# Patient Record
Sex: Male | Born: 1940 | Race: White | Hispanic: No | Marital: Married | Smoking: Former smoker
Health system: Southern US, Community
[De-identification: ages and names within clinical notes are randomized; demographics above are authoritative.]

## PROBLEM LIST (undated history)

## (undated) DIAGNOSIS — C801 Malignant (primary) neoplasm, unspecified: Secondary | ICD-10-CM

## (undated) DIAGNOSIS — I639 Cerebral infarction, unspecified: Secondary | ICD-10-CM

## (undated) DIAGNOSIS — I10 Essential (primary) hypertension: Secondary | ICD-10-CM

## (undated) DIAGNOSIS — M1990 Unspecified osteoarthritis, unspecified site: Secondary | ICD-10-CM

## (undated) DIAGNOSIS — R238 Other skin changes: Secondary | ICD-10-CM

## (undated) DIAGNOSIS — M2550 Pain in unspecified joint: Secondary | ICD-10-CM

## (undated) DIAGNOSIS — J302 Other seasonal allergic rhinitis: Secondary | ICD-10-CM

## (undated) DIAGNOSIS — H269 Unspecified cataract: Secondary | ICD-10-CM

## (undated) DIAGNOSIS — R233 Spontaneous ecchymoses: Secondary | ICD-10-CM

## (undated) HISTORY — PX: OTHER SURGICAL HISTORY: SHX169

## (undated) HISTORY — PX: ESOPHAGOGASTRODUODENOSCOPY: SHX1529

## (undated) HISTORY — PX: COLONOSCOPY: SHX174

---

## 2011-07-10 DIAGNOSIS — L989 Disorder of the skin and subcutaneous tissue, unspecified: Secondary | ICD-10-CM | POA: Diagnosis not present

## 2011-07-10 DIAGNOSIS — E538 Deficiency of other specified B group vitamins: Secondary | ICD-10-CM | POA: Diagnosis not present

## 2011-07-14 DIAGNOSIS — D045 Carcinoma in situ of skin of trunk: Secondary | ICD-10-CM | POA: Diagnosis not present

## 2011-07-14 DIAGNOSIS — L570 Actinic keratosis: Secondary | ICD-10-CM | POA: Diagnosis not present

## 2011-07-14 DIAGNOSIS — C4442 Squamous cell carcinoma of skin of scalp and neck: Secondary | ICD-10-CM | POA: Diagnosis not present

## 2011-07-24 DIAGNOSIS — E538 Deficiency of other specified B group vitamins: Secondary | ICD-10-CM | POA: Diagnosis not present

## 2011-07-24 DIAGNOSIS — J449 Chronic obstructive pulmonary disease, unspecified: Secondary | ICD-10-CM | POA: Diagnosis not present

## 2011-07-24 DIAGNOSIS — J4489 Other specified chronic obstructive pulmonary disease: Secondary | ICD-10-CM | POA: Diagnosis not present

## 2011-07-24 DIAGNOSIS — I10 Essential (primary) hypertension: Secondary | ICD-10-CM | POA: Diagnosis not present

## 2011-07-24 DIAGNOSIS — IMO0002 Reserved for concepts with insufficient information to code with codable children: Secondary | ICD-10-CM | POA: Diagnosis not present

## 2011-07-24 DIAGNOSIS — M159 Polyosteoarthritis, unspecified: Secondary | ICD-10-CM | POA: Diagnosis not present

## 2011-07-24 DIAGNOSIS — Z79899 Other long term (current) drug therapy: Secondary | ICD-10-CM | POA: Diagnosis not present

## 2011-07-24 DIAGNOSIS — F17200 Nicotine dependence, unspecified, uncomplicated: Secondary | ICD-10-CM | POA: Diagnosis not present

## 2011-07-28 DIAGNOSIS — R7989 Other specified abnormal findings of blood chemistry: Secondary | ICD-10-CM | POA: Diagnosis not present

## 2011-07-28 DIAGNOSIS — E871 Hypo-osmolality and hyponatremia: Secondary | ICD-10-CM | POA: Diagnosis not present

## 2011-08-01 DIAGNOSIS — D649 Anemia, unspecified: Secondary | ICD-10-CM | POA: Diagnosis not present

## 2011-08-01 DIAGNOSIS — D473 Essential (hemorrhagic) thrombocythemia: Secondary | ICD-10-CM | POA: Diagnosis not present

## 2011-08-10 DIAGNOSIS — D649 Anemia, unspecified: Secondary | ICD-10-CM | POA: Diagnosis not present

## 2011-08-10 DIAGNOSIS — D696 Thrombocytopenia, unspecified: Secondary | ICD-10-CM | POA: Diagnosis not present

## 2011-08-10 DIAGNOSIS — D473 Essential (hemorrhagic) thrombocythemia: Secondary | ICD-10-CM | POA: Diagnosis not present

## 2011-08-10 DIAGNOSIS — E538 Deficiency of other specified B group vitamins: Secondary | ICD-10-CM | POA: Diagnosis not present

## 2011-08-18 DIAGNOSIS — D473 Essential (hemorrhagic) thrombocythemia: Secondary | ICD-10-CM | POA: Diagnosis not present

## 2011-08-18 DIAGNOSIS — D649 Anemia, unspecified: Secondary | ICD-10-CM | POA: Diagnosis not present

## 2011-08-28 DIAGNOSIS — L570 Actinic keratosis: Secondary | ICD-10-CM | POA: Diagnosis not present

## 2011-08-28 DIAGNOSIS — D045 Carcinoma in situ of skin of trunk: Secondary | ICD-10-CM | POA: Diagnosis not present

## 2011-09-07 DIAGNOSIS — E538 Deficiency of other specified B group vitamins: Secondary | ICD-10-CM | POA: Diagnosis not present

## 2011-09-08 DIAGNOSIS — M159 Polyosteoarthritis, unspecified: Secondary | ICD-10-CM | POA: Diagnosis not present

## 2011-09-08 DIAGNOSIS — Z79899 Other long term (current) drug therapy: Secondary | ICD-10-CM | POA: Diagnosis not present

## 2011-10-09 DIAGNOSIS — E538 Deficiency of other specified B group vitamins: Secondary | ICD-10-CM | POA: Diagnosis not present

## 2011-10-17 DIAGNOSIS — D473 Essential (hemorrhagic) thrombocythemia: Secondary | ICD-10-CM | POA: Diagnosis not present

## 2011-10-19 DIAGNOSIS — D509 Iron deficiency anemia, unspecified: Secondary | ICD-10-CM | POA: Diagnosis not present

## 2011-10-23 DIAGNOSIS — I10 Essential (primary) hypertension: Secondary | ICD-10-CM | POA: Diagnosis not present

## 2011-10-23 DIAGNOSIS — J449 Chronic obstructive pulmonary disease, unspecified: Secondary | ICD-10-CM | POA: Diagnosis not present

## 2011-10-23 DIAGNOSIS — E538 Deficiency of other specified B group vitamins: Secondary | ICD-10-CM | POA: Diagnosis not present

## 2011-10-23 DIAGNOSIS — M159 Polyosteoarthritis, unspecified: Secondary | ICD-10-CM | POA: Diagnosis not present

## 2011-11-08 DIAGNOSIS — E538 Deficiency of other specified B group vitamins: Secondary | ICD-10-CM | POA: Diagnosis not present

## 2011-12-11 DIAGNOSIS — E538 Deficiency of other specified B group vitamins: Secondary | ICD-10-CM | POA: Diagnosis not present

## 2011-12-13 DIAGNOSIS — M159 Polyosteoarthritis, unspecified: Secondary | ICD-10-CM | POA: Diagnosis not present

## 2011-12-13 DIAGNOSIS — Z79899 Other long term (current) drug therapy: Secondary | ICD-10-CM | POA: Diagnosis not present

## 2012-01-10 DIAGNOSIS — E538 Deficiency of other specified B group vitamins: Secondary | ICD-10-CM | POA: Diagnosis not present

## 2012-02-01 DIAGNOSIS — M159 Polyosteoarthritis, unspecified: Secondary | ICD-10-CM | POA: Diagnosis not present

## 2012-02-01 DIAGNOSIS — Z79899 Other long term (current) drug therapy: Secondary | ICD-10-CM | POA: Diagnosis not present

## 2012-02-12 DIAGNOSIS — E538 Deficiency of other specified B group vitamins: Secondary | ICD-10-CM | POA: Diagnosis not present

## 2012-02-16 DIAGNOSIS — D509 Iron deficiency anemia, unspecified: Secondary | ICD-10-CM | POA: Diagnosis not present

## 2012-02-16 DIAGNOSIS — D473 Essential (hemorrhagic) thrombocythemia: Secondary | ICD-10-CM | POA: Diagnosis not present

## 2012-02-26 DIAGNOSIS — D4620 Refractory anemia with excess of blasts, unspecified: Secondary | ICD-10-CM | POA: Diagnosis not present

## 2012-02-28 ENCOUNTER — Other Ambulatory Visit (HOSPITAL_COMMUNITY)
Admission: RE | Admit: 2012-02-28 | Discharge: 2012-02-28 | Disposition: A | Discharge status: Home / Self Care | Payer: Medicare Other | Source: Ambulatory Visit | Attending: Oncology | Admitting: Oncology

## 2012-02-28 DIAGNOSIS — D473 Essential (hemorrhagic) thrombocythemia: Secondary | ICD-10-CM | POA: Diagnosis not present

## 2012-02-28 DIAGNOSIS — D649 Anemia, unspecified: Secondary | ICD-10-CM | POA: Insufficient documentation

## 2012-02-28 DIAGNOSIS — D4620 Refractory anemia with excess of blasts, unspecified: Secondary | ICD-10-CM | POA: Diagnosis not present

## 2012-03-08 DIAGNOSIS — D649 Anemia, unspecified: Secondary | ICD-10-CM | POA: Diagnosis not present

## 2012-03-14 DIAGNOSIS — E538 Deficiency of other specified B group vitamins: Secondary | ICD-10-CM | POA: Diagnosis not present

## 2012-03-25 DIAGNOSIS — Z23 Encounter for immunization: Secondary | ICD-10-CM | POA: Diagnosis not present

## 2012-04-11 DIAGNOSIS — D0460 Carcinoma in situ of skin of unspecified upper limb, including shoulder: Secondary | ICD-10-CM | POA: Diagnosis not present

## 2012-04-11 DIAGNOSIS — L570 Actinic keratosis: Secondary | ICD-10-CM | POA: Diagnosis not present

## 2012-04-15 DIAGNOSIS — D473 Essential (hemorrhagic) thrombocythemia: Secondary | ICD-10-CM | POA: Diagnosis not present

## 2012-04-15 DIAGNOSIS — D649 Anemia, unspecified: Secondary | ICD-10-CM | POA: Diagnosis not present

## 2012-04-15 DIAGNOSIS — E538 Deficiency of other specified B group vitamins: Secondary | ICD-10-CM | POA: Diagnosis not present

## 2012-04-15 DIAGNOSIS — Z23 Encounter for immunization: Secondary | ICD-10-CM | POA: Diagnosis not present

## 2012-04-23 DIAGNOSIS — Z79899 Other long term (current) drug therapy: Secondary | ICD-10-CM | POA: Diagnosis not present

## 2012-04-23 DIAGNOSIS — J069 Acute upper respiratory infection, unspecified: Secondary | ICD-10-CM | POA: Diagnosis not present

## 2012-04-23 DIAGNOSIS — M159 Polyosteoarthritis, unspecified: Secondary | ICD-10-CM | POA: Diagnosis not present

## 2012-04-29 DIAGNOSIS — J441 Chronic obstructive pulmonary disease with (acute) exacerbation: Secondary | ICD-10-CM | POA: Diagnosis not present

## 2012-05-14 DIAGNOSIS — D638 Anemia in other chronic diseases classified elsewhere: Secondary | ICD-10-CM | POA: Diagnosis not present

## 2012-05-14 DIAGNOSIS — Z09 Encounter for follow-up examination after completed treatment for conditions other than malignant neoplasm: Secondary | ICD-10-CM | POA: Diagnosis not present

## 2012-05-14 DIAGNOSIS — D473 Essential (hemorrhagic) thrombocythemia: Secondary | ICD-10-CM | POA: Diagnosis not present

## 2012-05-20 DIAGNOSIS — E538 Deficiency of other specified B group vitamins: Secondary | ICD-10-CM | POA: Diagnosis not present

## 2012-05-28 DIAGNOSIS — L570 Actinic keratosis: Secondary | ICD-10-CM | POA: Diagnosis not present

## 2012-06-20 DIAGNOSIS — E538 Deficiency of other specified B group vitamins: Secondary | ICD-10-CM | POA: Diagnosis not present

## 2012-07-01 DIAGNOSIS — L570 Actinic keratosis: Secondary | ICD-10-CM | POA: Diagnosis not present

## 2012-07-03 DIAGNOSIS — I10 Essential (primary) hypertension: Secondary | ICD-10-CM | POA: Diagnosis not present

## 2012-07-03 DIAGNOSIS — Z79899 Other long term (current) drug therapy: Secondary | ICD-10-CM | POA: Diagnosis not present

## 2012-07-03 DIAGNOSIS — M169 Osteoarthritis of hip, unspecified: Secondary | ICD-10-CM | POA: Diagnosis not present

## 2012-07-03 DIAGNOSIS — Z125 Encounter for screening for malignant neoplasm of prostate: Secondary | ICD-10-CM | POA: Diagnosis not present

## 2012-07-03 DIAGNOSIS — M1710 Unilateral primary osteoarthritis, unspecified knee: Secondary | ICD-10-CM | POA: Diagnosis not present

## 2012-07-03 DIAGNOSIS — J449 Chronic obstructive pulmonary disease, unspecified: Secondary | ICD-10-CM | POA: Diagnosis not present

## 2012-07-03 DIAGNOSIS — M1610 Unilateral primary osteoarthritis, unspecified hip: Secondary | ICD-10-CM | POA: Diagnosis not present

## 2012-07-22 DIAGNOSIS — E538 Deficiency of other specified B group vitamins: Secondary | ICD-10-CM | POA: Diagnosis not present

## 2012-08-22 DIAGNOSIS — E538 Deficiency of other specified B group vitamins: Secondary | ICD-10-CM | POA: Diagnosis not present

## 2012-08-22 DIAGNOSIS — Z125 Encounter for screening for malignant neoplasm of prostate: Secondary | ICD-10-CM | POA: Diagnosis not present

## 2012-08-22 DIAGNOSIS — I10 Essential (primary) hypertension: Secondary | ICD-10-CM | POA: Diagnosis not present

## 2012-08-22 DIAGNOSIS — M159 Polyosteoarthritis, unspecified: Secondary | ICD-10-CM | POA: Diagnosis not present

## 2012-09-06 DIAGNOSIS — Z961 Presence of intraocular lens: Secondary | ICD-10-CM | POA: Diagnosis not present

## 2012-09-06 DIAGNOSIS — H26499 Other secondary cataract, unspecified eye: Secondary | ICD-10-CM | POA: Diagnosis not present

## 2012-09-09 DIAGNOSIS — H26499 Other secondary cataract, unspecified eye: Secondary | ICD-10-CM | POA: Diagnosis not present

## 2012-09-13 DIAGNOSIS — D473 Essential (hemorrhagic) thrombocythemia: Secondary | ICD-10-CM | POA: Diagnosis not present

## 2012-09-13 DIAGNOSIS — D649 Anemia, unspecified: Secondary | ICD-10-CM | POA: Diagnosis not present

## 2012-09-23 DIAGNOSIS — E538 Deficiency of other specified B group vitamins: Secondary | ICD-10-CM | POA: Diagnosis not present

## 2012-10-16 DIAGNOSIS — C44621 Squamous cell carcinoma of skin of unspecified upper limb, including shoulder: Secondary | ICD-10-CM | POA: Diagnosis not present

## 2012-10-24 DIAGNOSIS — E538 Deficiency of other specified B group vitamins: Secondary | ICD-10-CM | POA: Diagnosis not present

## 2012-10-31 DIAGNOSIS — K2900 Acute gastritis without bleeding: Secondary | ICD-10-CM | POA: Diagnosis not present

## 2012-10-31 DIAGNOSIS — M159 Polyosteoarthritis, unspecified: Secondary | ICD-10-CM | POA: Diagnosis not present

## 2012-11-25 DIAGNOSIS — E538 Deficiency of other specified B group vitamins: Secondary | ICD-10-CM | POA: Diagnosis not present

## 2012-12-17 DIAGNOSIS — D649 Anemia, unspecified: Secondary | ICD-10-CM | POA: Diagnosis not present

## 2012-12-17 DIAGNOSIS — M069 Rheumatoid arthritis, unspecified: Secondary | ICD-10-CM | POA: Diagnosis not present

## 2012-12-25 DIAGNOSIS — E538 Deficiency of other specified B group vitamins: Secondary | ICD-10-CM | POA: Diagnosis not present

## 2012-12-25 DIAGNOSIS — M159 Polyosteoarthritis, unspecified: Secondary | ICD-10-CM | POA: Diagnosis not present

## 2012-12-27 DIAGNOSIS — D509 Iron deficiency anemia, unspecified: Secondary | ICD-10-CM | POA: Diagnosis not present

## 2013-01-15 DIAGNOSIS — C44319 Basal cell carcinoma of skin of other parts of face: Secondary | ICD-10-CM | POA: Diagnosis not present

## 2013-01-15 DIAGNOSIS — L570 Actinic keratosis: Secondary | ICD-10-CM | POA: Diagnosis not present

## 2013-01-27 DIAGNOSIS — E538 Deficiency of other specified B group vitamins: Secondary | ICD-10-CM | POA: Diagnosis not present

## 2013-02-25 DIAGNOSIS — D473 Essential (hemorrhagic) thrombocythemia: Secondary | ICD-10-CM | POA: Diagnosis not present

## 2013-02-25 DIAGNOSIS — D649 Anemia, unspecified: Secondary | ICD-10-CM | POA: Diagnosis not present

## 2013-02-27 DIAGNOSIS — D649 Anemia, unspecified: Secondary | ICD-10-CM | POA: Diagnosis not present

## 2013-02-27 DIAGNOSIS — E538 Deficiency of other specified B group vitamins: Secondary | ICD-10-CM | POA: Diagnosis not present

## 2013-02-27 DIAGNOSIS — D473 Essential (hemorrhagic) thrombocythemia: Secondary | ICD-10-CM | POA: Diagnosis not present

## 2013-03-04 DIAGNOSIS — Z79899 Other long term (current) drug therapy: Secondary | ICD-10-CM | POA: Diagnosis not present

## 2013-03-04 DIAGNOSIS — N529 Male erectile dysfunction, unspecified: Secondary | ICD-10-CM | POA: Diagnosis not present

## 2013-03-04 DIAGNOSIS — E538 Deficiency of other specified B group vitamins: Secondary | ICD-10-CM | POA: Diagnosis not present

## 2013-03-04 DIAGNOSIS — Z23 Encounter for immunization: Secondary | ICD-10-CM | POA: Diagnosis not present

## 2013-03-04 DIAGNOSIS — M159 Polyosteoarthritis, unspecified: Secondary | ICD-10-CM | POA: Diagnosis not present

## 2013-03-31 DIAGNOSIS — E538 Deficiency of other specified B group vitamins: Secondary | ICD-10-CM | POA: Diagnosis not present

## 2013-05-01 DIAGNOSIS — E538 Deficiency of other specified B group vitamins: Secondary | ICD-10-CM | POA: Diagnosis not present

## 2013-05-05 DIAGNOSIS — Z09 Encounter for follow-up examination after completed treatment for conditions other than malignant neoplasm: Secondary | ICD-10-CM | POA: Diagnosis not present

## 2013-05-05 DIAGNOSIS — D649 Anemia, unspecified: Secondary | ICD-10-CM | POA: Diagnosis not present

## 2013-05-12 DIAGNOSIS — M159 Polyosteoarthritis, unspecified: Secondary | ICD-10-CM | POA: Diagnosis not present

## 2013-06-02 DIAGNOSIS — E538 Deficiency of other specified B group vitamins: Secondary | ICD-10-CM | POA: Diagnosis not present

## 2013-07-03 DIAGNOSIS — E538 Deficiency of other specified B group vitamins: Secondary | ICD-10-CM | POA: Diagnosis not present

## 2013-07-07 DIAGNOSIS — Z79899 Other long term (current) drug therapy: Secondary | ICD-10-CM | POA: Diagnosis not present

## 2013-07-07 DIAGNOSIS — J209 Acute bronchitis, unspecified: Secondary | ICD-10-CM | POA: Diagnosis not present

## 2013-07-07 DIAGNOSIS — J069 Acute upper respiratory infection, unspecified: Secondary | ICD-10-CM | POA: Diagnosis not present

## 2013-07-16 DIAGNOSIS — C44320 Squamous cell carcinoma of skin of unspecified parts of face: Secondary | ICD-10-CM | POA: Diagnosis not present

## 2013-07-16 DIAGNOSIS — L570 Actinic keratosis: Secondary | ICD-10-CM | POA: Diagnosis not present

## 2013-07-16 DIAGNOSIS — C4442 Squamous cell carcinoma of skin of scalp and neck: Secondary | ICD-10-CM | POA: Diagnosis not present

## 2013-08-04 DIAGNOSIS — E538 Deficiency of other specified B group vitamins: Secondary | ICD-10-CM | POA: Diagnosis not present

## 2013-08-20 DIAGNOSIS — C44320 Squamous cell carcinoma of skin of unspecified parts of face: Secondary | ICD-10-CM | POA: Diagnosis not present

## 2013-08-27 DIAGNOSIS — C4442 Squamous cell carcinoma of skin of scalp and neck: Secondary | ICD-10-CM | POA: Diagnosis not present

## 2013-09-04 DIAGNOSIS — E538 Deficiency of other specified B group vitamins: Secondary | ICD-10-CM | POA: Diagnosis not present

## 2013-09-12 DIAGNOSIS — Z09 Encounter for follow-up examination after completed treatment for conditions other than malignant neoplasm: Secondary | ICD-10-CM | POA: Diagnosis not present

## 2013-09-12 DIAGNOSIS — D649 Anemia, unspecified: Secondary | ICD-10-CM | POA: Diagnosis not present

## 2013-09-23 DIAGNOSIS — E538 Deficiency of other specified B group vitamins: Secondary | ICD-10-CM | POA: Diagnosis not present

## 2013-09-23 DIAGNOSIS — J449 Chronic obstructive pulmonary disease, unspecified: Secondary | ICD-10-CM | POA: Diagnosis not present

## 2013-09-23 DIAGNOSIS — Z9181 History of falling: Secondary | ICD-10-CM | POA: Diagnosis not present

## 2013-09-23 DIAGNOSIS — Z125 Encounter for screening for malignant neoplasm of prostate: Secondary | ICD-10-CM | POA: Diagnosis not present

## 2013-09-23 DIAGNOSIS — M169 Osteoarthritis of hip, unspecified: Secondary | ICD-10-CM | POA: Diagnosis not present

## 2013-09-23 DIAGNOSIS — I10 Essential (primary) hypertension: Secondary | ICD-10-CM | POA: Diagnosis not present

## 2013-09-23 DIAGNOSIS — Z1331 Encounter for screening for depression: Secondary | ICD-10-CM | POA: Diagnosis not present

## 2013-09-23 DIAGNOSIS — M1610 Unilateral primary osteoarthritis, unspecified hip: Secondary | ICD-10-CM | POA: Diagnosis not present

## 2013-10-06 DIAGNOSIS — E538 Deficiency of other specified B group vitamins: Secondary | ICD-10-CM | POA: Diagnosis not present

## 2013-10-16 DIAGNOSIS — L570 Actinic keratosis: Secondary | ICD-10-CM | POA: Diagnosis not present

## 2013-10-16 DIAGNOSIS — D0460 Carcinoma in situ of skin of unspecified upper limb, including shoulder: Secondary | ICD-10-CM | POA: Diagnosis not present

## 2013-11-05 DIAGNOSIS — E538 Deficiency of other specified B group vitamins: Secondary | ICD-10-CM | POA: Diagnosis not present

## 2013-11-11 DIAGNOSIS — D649 Anemia, unspecified: Secondary | ICD-10-CM | POA: Diagnosis not present

## 2013-11-18 DIAGNOSIS — L570 Actinic keratosis: Secondary | ICD-10-CM | POA: Diagnosis not present

## 2013-12-01 DIAGNOSIS — Z79899 Other long term (current) drug therapy: Secondary | ICD-10-CM | POA: Diagnosis not present

## 2013-12-01 DIAGNOSIS — M159 Polyosteoarthritis, unspecified: Secondary | ICD-10-CM | POA: Diagnosis not present

## 2013-12-01 DIAGNOSIS — M1610 Unilateral primary osteoarthritis, unspecified hip: Secondary | ICD-10-CM | POA: Diagnosis not present

## 2013-12-01 DIAGNOSIS — M1710 Unilateral primary osteoarthritis, unspecified knee: Secondary | ICD-10-CM | POA: Diagnosis not present

## 2013-12-01 DIAGNOSIS — M169 Osteoarthritis of hip, unspecified: Secondary | ICD-10-CM | POA: Diagnosis not present

## 2013-12-01 DIAGNOSIS — IMO0002 Reserved for concepts with insufficient information to code with codable children: Secondary | ICD-10-CM | POA: Diagnosis not present

## 2013-12-08 DIAGNOSIS — E538 Deficiency of other specified B group vitamins: Secondary | ICD-10-CM | POA: Diagnosis not present

## 2013-12-11 DIAGNOSIS — Z1211 Encounter for screening for malignant neoplasm of colon: Secondary | ICD-10-CM | POA: Diagnosis not present

## 2013-12-11 DIAGNOSIS — D649 Anemia, unspecified: Secondary | ICD-10-CM | POA: Diagnosis not present

## 2013-12-26 DIAGNOSIS — D126 Benign neoplasm of colon, unspecified: Secondary | ICD-10-CM | POA: Diagnosis not present

## 2013-12-26 DIAGNOSIS — K5730 Diverticulosis of large intestine without perforation or abscess without bleeding: Secondary | ICD-10-CM | POA: Diagnosis not present

## 2013-12-26 DIAGNOSIS — D500 Iron deficiency anemia secondary to blood loss (chronic): Secondary | ICD-10-CM | POA: Diagnosis not present

## 2013-12-26 DIAGNOSIS — K319 Disease of stomach and duodenum, unspecified: Secondary | ICD-10-CM | POA: Diagnosis not present

## 2014-01-08 DIAGNOSIS — E538 Deficiency of other specified B group vitamins: Secondary | ICD-10-CM | POA: Diagnosis not present

## 2014-01-20 DIAGNOSIS — J069 Acute upper respiratory infection, unspecified: Secondary | ICD-10-CM | POA: Diagnosis not present

## 2014-01-20 DIAGNOSIS — F17200 Nicotine dependence, unspecified, uncomplicated: Secondary | ICD-10-CM | POA: Diagnosis not present

## 2014-01-20 DIAGNOSIS — H6120 Impacted cerumen, unspecified ear: Secondary | ICD-10-CM | POA: Diagnosis not present

## 2014-01-20 DIAGNOSIS — Z79899 Other long term (current) drug therapy: Secondary | ICD-10-CM | POA: Diagnosis not present

## 2014-01-27 DIAGNOSIS — H6120 Impacted cerumen, unspecified ear: Secondary | ICD-10-CM | POA: Diagnosis not present

## 2014-01-27 DIAGNOSIS — M159 Polyosteoarthritis, unspecified: Secondary | ICD-10-CM | POA: Diagnosis not present

## 2014-02-03 DIAGNOSIS — D649 Anemia, unspecified: Secondary | ICD-10-CM | POA: Diagnosis not present

## 2014-02-03 DIAGNOSIS — D473 Essential (hemorrhagic) thrombocythemia: Secondary | ICD-10-CM | POA: Diagnosis not present

## 2014-02-09 DIAGNOSIS — E538 Deficiency of other specified B group vitamins: Secondary | ICD-10-CM | POA: Diagnosis not present

## 2014-03-05 DIAGNOSIS — L570 Actinic keratosis: Secondary | ICD-10-CM | POA: Diagnosis not present

## 2014-03-13 DIAGNOSIS — D649 Anemia, unspecified: Secondary | ICD-10-CM | POA: Diagnosis not present

## 2014-03-13 DIAGNOSIS — Z23 Encounter for immunization: Secondary | ICD-10-CM | POA: Diagnosis not present

## 2014-03-13 DIAGNOSIS — E538 Deficiency of other specified B group vitamins: Secondary | ICD-10-CM | POA: Diagnosis not present

## 2014-03-13 DIAGNOSIS — D473 Essential (hemorrhagic) thrombocythemia: Secondary | ICD-10-CM | POA: Diagnosis not present

## 2014-03-16 DIAGNOSIS — D649 Anemia, unspecified: Secondary | ICD-10-CM | POA: Diagnosis not present

## 2014-03-25 DIAGNOSIS — M159 Polyosteoarthritis, unspecified: Secondary | ICD-10-CM | POA: Diagnosis not present

## 2014-03-25 DIAGNOSIS — J309 Allergic rhinitis, unspecified: Secondary | ICD-10-CM | POA: Diagnosis not present

## 2014-03-25 DIAGNOSIS — Z79899 Other long term (current) drug therapy: Secondary | ICD-10-CM | POA: Diagnosis not present

## 2014-04-08 DIAGNOSIS — H6502 Acute serous otitis media, left ear: Secondary | ICD-10-CM | POA: Diagnosis not present

## 2014-04-08 DIAGNOSIS — H9192 Unspecified hearing loss, left ear: Secondary | ICD-10-CM | POA: Diagnosis not present

## 2014-04-08 DIAGNOSIS — J342 Deviated nasal septum: Secondary | ICD-10-CM | POA: Diagnosis not present

## 2014-04-08 DIAGNOSIS — J3489 Other specified disorders of nose and nasal sinuses: Secondary | ICD-10-CM | POA: Diagnosis not present

## 2014-04-13 DIAGNOSIS — E538 Deficiency of other specified B group vitamins: Secondary | ICD-10-CM | POA: Diagnosis not present

## 2014-04-23 DIAGNOSIS — H6502 Acute serous otitis media, left ear: Secondary | ICD-10-CM | POA: Diagnosis not present

## 2014-04-23 DIAGNOSIS — H6982 Other specified disorders of Eustachian tube, left ear: Secondary | ICD-10-CM | POA: Diagnosis not present

## 2014-04-23 DIAGNOSIS — J342 Deviated nasal septum: Secondary | ICD-10-CM | POA: Diagnosis not present

## 2014-05-12 DIAGNOSIS — M179 Osteoarthritis of knee, unspecified: Secondary | ICD-10-CM | POA: Diagnosis not present

## 2014-05-12 DIAGNOSIS — M159 Polyosteoarthritis, unspecified: Secondary | ICD-10-CM | POA: Diagnosis not present

## 2014-05-12 DIAGNOSIS — M1611 Unilateral primary osteoarthritis, right hip: Secondary | ICD-10-CM | POA: Diagnosis not present

## 2014-05-18 DIAGNOSIS — E538 Deficiency of other specified B group vitamins: Secondary | ICD-10-CM | POA: Diagnosis not present

## 2014-06-04 DIAGNOSIS — C44622 Squamous cell carcinoma of skin of right upper limb, including shoulder: Secondary | ICD-10-CM | POA: Diagnosis not present

## 2014-06-04 DIAGNOSIS — C44229 Squamous cell carcinoma of skin of left ear and external auricular canal: Secondary | ICD-10-CM | POA: Diagnosis not present

## 2014-06-23 DIAGNOSIS — E538 Deficiency of other specified B group vitamins: Secondary | ICD-10-CM | POA: Diagnosis not present

## 2014-07-21 DIAGNOSIS — D649 Anemia, unspecified: Secondary | ICD-10-CM | POA: Diagnosis not present

## 2014-07-27 DIAGNOSIS — Z79899 Other long term (current) drug therapy: Secondary | ICD-10-CM | POA: Diagnosis not present

## 2014-07-27 DIAGNOSIS — M179 Osteoarthritis of knee, unspecified: Secondary | ICD-10-CM | POA: Diagnosis not present

## 2014-07-27 DIAGNOSIS — E538 Deficiency of other specified B group vitamins: Secondary | ICD-10-CM | POA: Diagnosis not present

## 2014-07-27 DIAGNOSIS — M1611 Unilateral primary osteoarthritis, right hip: Secondary | ICD-10-CM | POA: Diagnosis not present

## 2014-07-30 DIAGNOSIS — L570 Actinic keratosis: Secondary | ICD-10-CM | POA: Diagnosis not present

## 2014-08-13 DIAGNOSIS — D649 Anemia, unspecified: Secondary | ICD-10-CM | POA: Diagnosis not present

## 2014-08-18 ENCOUNTER — Other Ambulatory Visit: Payer: Self-pay | Admitting: Oncology

## 2014-08-18 DIAGNOSIS — R29810 Facial weakness: Secondary | ICD-10-CM | POA: Diagnosis not present

## 2014-08-18 DIAGNOSIS — R531 Weakness: Secondary | ICD-10-CM | POA: Diagnosis not present

## 2014-08-18 DIAGNOSIS — R05 Cough: Secondary | ICD-10-CM | POA: Diagnosis not present

## 2014-08-18 DIAGNOSIS — F17210 Nicotine dependence, cigarettes, uncomplicated: Secondary | ICD-10-CM | POA: Diagnosis not present

## 2014-08-18 DIAGNOSIS — R42 Dizziness and giddiness: Secondary | ICD-10-CM | POA: Diagnosis not present

## 2014-08-18 DIAGNOSIS — I63541 Cerebral infarction due to unspecified occlusion or stenosis of right cerebellar artery: Secondary | ICD-10-CM | POA: Diagnosis not present

## 2014-08-18 DIAGNOSIS — M5134 Other intervertebral disc degeneration, thoracic region: Secondary | ICD-10-CM | POA: Diagnosis not present

## 2014-08-18 DIAGNOSIS — I639 Cerebral infarction, unspecified: Secondary | ICD-10-CM

## 2014-08-18 DIAGNOSIS — C4490 Unspecified malignant neoplasm of skin, unspecified: Secondary | ICD-10-CM | POA: Diagnosis not present

## 2014-08-18 DIAGNOSIS — J984 Other disorders of lung: Secondary | ICD-10-CM | POA: Diagnosis not present

## 2014-08-18 DIAGNOSIS — C3412 Malignant neoplasm of upper lobe, left bronchus or lung: Secondary | ICD-10-CM | POA: Diagnosis not present

## 2014-08-18 DIAGNOSIS — R918 Other nonspecific abnormal finding of lung field: Secondary | ICD-10-CM | POA: Diagnosis not present

## 2014-08-18 DIAGNOSIS — I350 Nonrheumatic aortic (valve) stenosis: Secondary | ICD-10-CM | POA: Diagnosis not present

## 2014-08-18 DIAGNOSIS — R404 Transient alteration of awareness: Secondary | ICD-10-CM | POA: Diagnosis not present

## 2014-08-18 DIAGNOSIS — I6523 Occlusion and stenosis of bilateral carotid arteries: Secondary | ICD-10-CM | POA: Diagnosis not present

## 2014-08-18 DIAGNOSIS — I10 Essential (primary) hypertension: Secondary | ICD-10-CM | POA: Diagnosis not present

## 2014-08-18 DIAGNOSIS — J90 Pleural effusion, not elsewhere classified: Secondary | ICD-10-CM | POA: Diagnosis not present

## 2014-08-18 DIAGNOSIS — R911 Solitary pulmonary nodule: Secondary | ICD-10-CM | POA: Diagnosis not present

## 2014-08-18 HISTORY — DX: Cerebral infarction, unspecified: I63.9

## 2014-08-23 DIAGNOSIS — I69354 Hemiplegia and hemiparesis following cerebral infarction affecting left non-dominant side: Secondary | ICD-10-CM | POA: Diagnosis not present

## 2014-08-23 DIAGNOSIS — R918 Other nonspecific abnormal finding of lung field: Secondary | ICD-10-CM | POA: Diagnosis not present

## 2014-08-26 DIAGNOSIS — I69354 Hemiplegia and hemiparesis following cerebral infarction affecting left non-dominant side: Secondary | ICD-10-CM | POA: Diagnosis not present

## 2014-08-26 DIAGNOSIS — R918 Other nonspecific abnormal finding of lung field: Secondary | ICD-10-CM | POA: Diagnosis not present

## 2014-08-27 DIAGNOSIS — E538 Deficiency of other specified B group vitamins: Secondary | ICD-10-CM | POA: Diagnosis not present

## 2014-08-27 DIAGNOSIS — I69354 Hemiplegia and hemiparesis following cerebral infarction affecting left non-dominant side: Secondary | ICD-10-CM | POA: Diagnosis not present

## 2014-08-27 DIAGNOSIS — Z8673 Personal history of transient ischemic attack (TIA), and cerebral infarction without residual deficits: Secondary | ICD-10-CM | POA: Diagnosis not present

## 2014-08-27 DIAGNOSIS — R918 Other nonspecific abnormal finding of lung field: Secondary | ICD-10-CM | POA: Diagnosis not present

## 2014-08-27 DIAGNOSIS — Z09 Encounter for follow-up examination after completed treatment for conditions other than malignant neoplasm: Secondary | ICD-10-CM | POA: Diagnosis not present

## 2014-08-28 DIAGNOSIS — R918 Other nonspecific abnormal finding of lung field: Secondary | ICD-10-CM | POA: Diagnosis not present

## 2014-08-28 DIAGNOSIS — I69354 Hemiplegia and hemiparesis following cerebral infarction affecting left non-dominant side: Secondary | ICD-10-CM | POA: Diagnosis not present

## 2014-08-31 DIAGNOSIS — F17210 Nicotine dependence, cigarettes, uncomplicated: Secondary | ICD-10-CM | POA: Diagnosis not present

## 2014-08-31 DIAGNOSIS — C3412 Malignant neoplasm of upper lobe, left bronchus or lung: Secondary | ICD-10-CM | POA: Diagnosis not present

## 2014-08-31 DIAGNOSIS — I10 Essential (primary) hypertension: Secondary | ICD-10-CM | POA: Diagnosis not present

## 2014-09-01 DIAGNOSIS — I69354 Hemiplegia and hemiparesis following cerebral infarction affecting left non-dominant side: Secondary | ICD-10-CM | POA: Diagnosis not present

## 2014-09-01 DIAGNOSIS — R918 Other nonspecific abnormal finding of lung field: Secondary | ICD-10-CM | POA: Diagnosis not present

## 2014-09-03 DIAGNOSIS — I69354 Hemiplegia and hemiparesis following cerebral infarction affecting left non-dominant side: Secondary | ICD-10-CM | POA: Diagnosis not present

## 2014-09-03 DIAGNOSIS — R918 Other nonspecific abnormal finding of lung field: Secondary | ICD-10-CM | POA: Diagnosis not present

## 2014-09-07 DIAGNOSIS — I69354 Hemiplegia and hemiparesis following cerebral infarction affecting left non-dominant side: Secondary | ICD-10-CM | POA: Diagnosis not present

## 2014-09-07 DIAGNOSIS — R918 Other nonspecific abnormal finding of lung field: Secondary | ICD-10-CM | POA: Diagnosis not present

## 2014-09-08 DIAGNOSIS — C3412 Malignant neoplasm of upper lobe, left bronchus or lung: Secondary | ICD-10-CM | POA: Diagnosis not present

## 2014-09-08 DIAGNOSIS — R590 Localized enlarged lymph nodes: Secondary | ICD-10-CM | POA: Diagnosis not present

## 2014-09-08 DIAGNOSIS — D649 Anemia, unspecified: Secondary | ICD-10-CM | POA: Diagnosis not present

## 2014-09-08 DIAGNOSIS — F17210 Nicotine dependence, cigarettes, uncomplicated: Secondary | ICD-10-CM | POA: Diagnosis not present

## 2014-09-08 DIAGNOSIS — R911 Solitary pulmonary nodule: Secondary | ICD-10-CM | POA: Diagnosis not present

## 2014-09-10 ENCOUNTER — Encounter: Payer: Medicare Other | Admitting: Cardiothoracic Surgery

## 2014-09-10 DIAGNOSIS — I69354 Hemiplegia and hemiparesis following cerebral infarction affecting left non-dominant side: Secondary | ICD-10-CM | POA: Diagnosis not present

## 2014-09-10 DIAGNOSIS — R918 Other nonspecific abnormal finding of lung field: Secondary | ICD-10-CM | POA: Diagnosis not present

## 2014-09-15 DIAGNOSIS — I69354 Hemiplegia and hemiparesis following cerebral infarction affecting left non-dominant side: Secondary | ICD-10-CM | POA: Diagnosis not present

## 2014-09-15 DIAGNOSIS — R918 Other nonspecific abnormal finding of lung field: Secondary | ICD-10-CM | POA: Diagnosis not present

## 2014-09-16 DIAGNOSIS — I69354 Hemiplegia and hemiparesis following cerebral infarction affecting left non-dominant side: Secondary | ICD-10-CM | POA: Diagnosis not present

## 2014-09-16 DIAGNOSIS — R918 Other nonspecific abnormal finding of lung field: Secondary | ICD-10-CM | POA: Diagnosis not present

## 2014-09-21 ENCOUNTER — Encounter: Payer: Self-pay | Admitting: Cardiothoracic Surgery

## 2014-09-21 ENCOUNTER — Institutional Professional Consult (permissible substitution) (INDEPENDENT_AMBULATORY_CARE_PROVIDER_SITE_OTHER): Payer: Medicare Other | Admitting: Cardiothoracic Surgery

## 2014-09-21 VITALS — BP 124/74 | HR 84 | Resp 16 | Ht 69.0 in | Wt 144.0 lb

## 2014-09-21 DIAGNOSIS — C3412 Malignant neoplasm of upper lobe, left bronchus or lung: Secondary | ICD-10-CM

## 2014-09-21 DIAGNOSIS — R599 Enlarged lymph nodes, unspecified: Secondary | ICD-10-CM | POA: Diagnosis not present

## 2014-09-21 DIAGNOSIS — M1990 Unspecified osteoarthritis, unspecified site: Secondary | ICD-10-CM | POA: Diagnosis not present

## 2014-09-21 DIAGNOSIS — I10 Essential (primary) hypertension: Secondary | ICD-10-CM | POA: Insufficient documentation

## 2014-09-21 DIAGNOSIS — C3410 Malignant neoplasm of upper lobe, unspecified bronchus or lung: Secondary | ICD-10-CM | POA: Insufficient documentation

## 2014-09-21 DIAGNOSIS — R590 Localized enlarged lymph nodes: Secondary | ICD-10-CM

## 2014-09-21 NOTE — Progress Notes (Signed)
PCP is No primary care provider on file. Referring Provider is Marice Potter, MD  Chief Complaint  Patient presents with  . Lung Cancer    LULobe per needle bx, CT CHEST, MRI BRAIN  on 08/18/14 @ [facility] and PET 09/08/14  patient examined, chest CT scan, PET scan and medical records from Dr. 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 Admission chest x-ray showed the nodular density in left upper lobe and subsequent CT scan of the chest showed this to be spiculated consistent with bronchogenic carcinoma. The patient had a 6 mm right lower lobe density and a 1.2 cm right pre-carinal lymph node. The patient was evaluated by oncology, Dr. Trinidad Sink and a PET scan was performed. This showed hypermetabolic activity of the left upper lobe lesion 7.0 SUV. The right supra-carinal lymph node had an activity of 2.3 SUV. The patient brain scan showed no evidence of metastatic disease. The PET scan showed no other areas of metastatic disease. The right lower lobe small nodule was without uptake on PET scan.  The patient was evaluated for a stroke with carotid duplex which showed bilateral moderate stenosis 40-60% stenosis. No history of atrial fibrillation.the patient has residual left-sided weakness. He has no difficulty swallowing. He denies headache.  The patient states he has cut back on his chronic smoking 1-2 cigarettes daily.the patient did short of breath with exertion. He has a chronic productive cough. Current allergy season has made this worse. The patient admits to a 30 pound weight loss over the past 4-6 months.  The oncology clinic report indicates stereotactic radiation therapy has been plan for treatment of the primary lesion if the lesion is stage I. I agree with this plan and not think  the patient is a surgical candidate with his recent stroke and severe COPD. The room air oxygen saturation at rest is 93% and drops less than 90% with exercise.the patient presents for transbronchial ultrasound directed biopsy of the mediastinal node to confirm that he does not have stage III lung cancer.  No past medical history on file.  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 Bobby Rumpf No diabetes No history of heart disease  No past surgical history on file.removal of skin cancers in the past  No family history on file. Father died of a stroke MI His mother died at age 4  Social history History  Substance Use Topics  . Smoking status: Former Smoker -- 1.00 packs/day for 50 years    Types: Cigarettes    Quit date: 09/21/1963  . Smokeless tobacco: Former Systems developer  . Alcohol Use: No  he drinks one-2 beers daily and  smokes1-2 cigarettes daily  No current outpatient prescriptions on file.   No current facility-administered medications for this visit.  current medications include Coreg6.25 mg twice a day aspirin 325, Zyrtec 10 mg daily, HCTZ 25 mg daily Naprosyn 500 mg by mouth 3 times a day when necessary  No Known Allergies  Review of Systems    General:    Poor appetite with weight loss of 25 pounds, no fever or night sweats Cardiac:      no Chest pain with exertion,  No chest pain at rest, +  SOB with exertion,  no PND,  no orthopnea, no  palpitations or arrhythmias,  no   history atrial fibrillation              no    dizzy spells-presyncope,  no Syncope,no  LE edema Respiratory:  +Shortness of breath,  no home oxygen,+productive cough, no                  sleep apnea, no CPAP at night, no hemoptysis, + COPD GI:           No difficulty swallowing, no reflux, no hiatal hernia-heartburn, no chronic                          abdominal pain, no hematochezia, no hematemesis, no melena GU:        No  dysuria, no frequency, no UTI recently, no hematuria, no kidney stones,               No BPH Vascular: No pain suggestive of claudication, no varicose veins, no DVT, no nonhealing                Foot ulcer, no rest pain suggestive of ischemia Neuro:   Positive for recent stroke to the right thalamus with moderate right facial droop and left upper and lower extremity weakness , no seizures, no neuropathy, positive for mild gait instability with dragging left foot  no      memory/cognitive dysfunction                Musculoskeletal:  Positive for arthritis of the shoulders and hips and knees no joint swelling, moderate difficulty walking, moderate decreased mobility                         Skin:     No rash, no ulcerations or pressure sores-history lung cancer resected Psych:    No anxiety, no depression, Eyes:    No change in vision, no amaurosis, no eye surgery ENT:    No hearing loss, no loose or painful teeth, no dentures, no recent dental                          procedure Hematologic:  No easy bruising, no bleeding disorder, no frequent epistaxis  positive for history of anemia followed by hematology Endocrine:  No diabetes,  - checks CBG at home    BP 124/74 mmHg  Pulse 84  Resp 16  Ht 5\' 9"  (1.753 m)  Wt 144 lb (65.318 kg)  BMI 21.26 kg/m2  SpO2 94% Physical Exam  General: ovary fragile white male who appears older than his stated age 2: Normocephalic pupils equal , dentition adequatebut with several missing teeth Neck: Supple without JVD, adenopathy, or bruit Chest: bilateral rhonchi on  auscultation, symmetrical breath sounds, , no tenderness             or deformity Cardiovascular: Regular rate and rhythm, no murmur, no gallop, peripheral pulsespalpable in both wrists and both groins, pedal pulses not palpable             p Abdomen:  Soft, nontender, no palpable mass or organomegaly Extremities: Warm, well-perfused, Mild clubbing   Without cyanosis edema or tenderness,               no venous stasis changes of the legs Rectal/GU: Deferred Neuro: Grossly with left upper and lower extremity weakness Skin:  Clean and dry without rash or ulceration. Multiple ecchymotic areas of his arms   Diagnostic Tests:CT scan of chest, PET scan reviewed and results discussed with patient   Impression: 74 year old gentleman presented with a right thalamic stroke left-sided body weakness and chest x-ray showed an incidental left upper lobe mass which by CT scan was consistent with a bronchogenic carcinoma. A PET scan this was hypermetabolic with SUV a 7.0. The patient underwent a transthoracic needle biopsy at [facility] which was positive for squamous cell cancer. The patient is planned to undergo stereotactic radiation therapy of the left upper lobe cancer. However the patient's oncologist appropriately has requested a transbronchial biopsy of the mediastinal node in the precarinal space which is  mildly positive on PET scan  The patient is not a candidate for several resection of the primary left upper lobe cancer  The patient has had a recent stroke and has current cough and allergic symptoms to the high pollen condition.  Patient is be scheduled for transbronchial biopsy of the precarinal node under general anesthesia. Prior to scheduling surgery we'll optimize his bronchitis with a prednisone taper and have told the patient is completely stop smoking.    Plan: Transbronchial ultrasound guided biopsy of the precarinal lymph node with positive activity onPET scan at [place] April 18 under general anesthesia. If lymph node is  negative the patient appears  to be a good candidate for stereotactic radiation therapy of  the left upper lobe confirmed squamous cell cancer.  Len Childs, MD [facility] [phone]

## 2014-09-22 ENCOUNTER — Encounter: Payer: Medicare Other | Admitting: Thoracic Surgery (Cardiothoracic Vascular Surgery)

## 2014-09-22 ENCOUNTER — Other Ambulatory Visit: Payer: Self-pay | Admitting: *Deleted

## 2014-09-22 DIAGNOSIS — C3412 Malignant neoplasm of upper lobe, left bronchus or lung: Secondary | ICD-10-CM | POA: Diagnosis not present

## 2014-09-22 DIAGNOSIS — C3490 Malignant neoplasm of unspecified part of unspecified bronchus or lung: Secondary | ICD-10-CM

## 2014-09-28 DIAGNOSIS — M1611 Unilateral primary osteoarthritis, right hip: Secondary | ICD-10-CM | POA: Diagnosis not present

## 2014-09-28 DIAGNOSIS — M179 Osteoarthritis of knee, unspecified: Secondary | ICD-10-CM | POA: Diagnosis not present

## 2014-09-28 DIAGNOSIS — Z79899 Other long term (current) drug therapy: Secondary | ICD-10-CM | POA: Diagnosis not present

## 2014-09-28 DIAGNOSIS — E538 Deficiency of other specified B group vitamins: Secondary | ICD-10-CM | POA: Diagnosis not present

## 2014-09-28 DIAGNOSIS — Z6822 Body mass index (BMI) 22.0-22.9, adult: Secondary | ICD-10-CM | POA: Diagnosis not present

## 2014-09-30 ENCOUNTER — Encounter (HOSPITAL_COMMUNITY): Payer: Self-pay

## 2014-09-30 ENCOUNTER — Encounter (HOSPITAL_COMMUNITY)
Admission: RE | Admit: 2014-09-30 | Discharge: 2014-09-30 | Disposition: A | Discharge status: Home / Self Care | Payer: Medicare Other | Source: Ambulatory Visit | Attending: Cardiothoracic Surgery | Admitting: Cardiothoracic Surgery

## 2014-09-30 ENCOUNTER — Other Ambulatory Visit: Payer: Self-pay

## 2014-09-30 VITALS — BP 152/69 | HR 74 | Temp 98.6°F | Resp 18 | Ht 69.0 in | Wt 146.0 lb

## 2014-09-30 DIAGNOSIS — Z01812 Encounter for preprocedural laboratory examination: Secondary | ICD-10-CM | POA: Insufficient documentation

## 2014-09-30 DIAGNOSIS — C3490 Malignant neoplasm of unspecified part of unspecified bronchus or lung: Secondary | ICD-10-CM

## 2014-09-30 DIAGNOSIS — C3412 Malignant neoplasm of upper lobe, left bronchus or lung: Secondary | ICD-10-CM | POA: Diagnosis not present

## 2014-09-30 DIAGNOSIS — Z01810 Encounter for preprocedural cardiovascular examination: Secondary | ICD-10-CM | POA: Diagnosis not present

## 2014-09-30 HISTORY — DX: Unspecified cataract: H26.9

## 2014-09-30 HISTORY — DX: Malignant (primary) neoplasm, unspecified: C80.1

## 2014-09-30 HISTORY — DX: Other skin changes: R23.8

## 2014-09-30 HISTORY — DX: Other seasonal allergic rhinitis: J30.2

## 2014-09-30 HISTORY — DX: Spontaneous ecchymoses: R23.3

## 2014-09-30 HISTORY — DX: Pain in unspecified joint: M25.50

## 2014-09-30 HISTORY — DX: Cerebral infarction, unspecified: I63.9

## 2014-09-30 HISTORY — DX: Essential (primary) hypertension: I10

## 2014-09-30 HISTORY — DX: Unspecified osteoarthritis, unspecified site: M19.90

## 2014-09-30 LAB — COMPREHENSIVE METABOLIC PANEL
ALT: 17 U/L (ref 0–53)
AST: 20 U/L (ref 0–37)
Albumin: 2.7 g/dL — ABNORMAL LOW (ref 3.5–5.2)
Alkaline Phosphatase: 54 U/L (ref 39–117)
Anion gap: 11 (ref 5–15)
BUN: 22 mg/dL (ref 6–23)
CO2: 26 mmol/L (ref 19–32)
Calcium: 8.7 mg/dL (ref 8.4–10.5)
Chloride: 98 mmol/L (ref 96–112)
Creatinine, Ser: 0.93 mg/dL (ref 0.50–1.35)
GFR calc Af Amer: >90 mL/min (ref >90)
GFR calc non Af Amer: 83 mL/min — ABNORMAL LOW (ref >90)
Glucose, Bld: 104 mg/dL — ABNORMAL HIGH (ref 70–99)
Potassium: 3.7 mmol/L (ref 3.5–5.1)
Sodium: 135 mmol/L (ref 135–145)
Total Bilirubin: 0.8 mg/dL (ref 0.3–1.2)
Total Protein: 7.2 g/dL (ref 6.0–8.3)

## 2014-09-30 LAB — CBC
HCT: 29.4 % — ABNORMAL LOW (ref 39.0–52.0)
Hemoglobin: 9.3 g/dL — ABNORMAL LOW (ref 13.0–17.0)
MCH: 28.5 pg (ref 26.0–34.0)
MCHC: 31.6 g/dL (ref 30.0–36.0)
MCV: 90.2 fL (ref 78.0–100.0)
Platelets: 600 10*3/uL — ABNORMAL HIGH (ref 150–400)
RBC: 3.26 MIL/uL — ABNORMAL LOW (ref 4.22–5.81)
RDW: 15.7 % — ABNORMAL HIGH (ref 11.5–15.5)
WBC: 7.3 10*3/uL (ref 4.0–10.5)

## 2014-09-30 LAB — PROTIME-INR
INR: 1.08 (ref 0.00–1.49)
Prothrombin Time: 14.1 seconds (ref 11.6–15.2)

## 2014-09-30 LAB — APTT: aPTT: 33 seconds (ref 24–37)

## 2014-09-30 NOTE — Progress Notes (Addendum)
Pt doesn't have a Cardiologist   Denies ever having Echo/stress test/heart cath  Medical Md is Dr.Stephen Megan Salon  Denies EKG in the past yr

## 2014-09-30 NOTE — Pre-Procedure Instructions (Signed)
David Branch  09/30/2014   Your procedure is scheduled on: Wed, April 20 @ 8:30 AM  Report to [facility]  at 6:30 AM.  Call this number if you have problems the morning of surgery: [phone]   Remember:   Do not eat food or drink liquids after midnight.   Take these medicines the morning of surgery with A SIP OF WATER: Carvedilol(Coreg),Zyrtec(Cetirizine),and Fluticasone(Flonase-if needed)              Stop taking your Aspirin. No Goody's,BC's,Aleve,Ibuprofen,Fish Oil,or any Herbal Medications.    Do not wear jewelry, make-up or nail polish.  Do not wear lotions, powders, or perfumes. You may wear deodorant.  Do not shave 48 hours prior to surgery. Men may shave face and neck.  Do not bring valuables to the hospital.  [facility] is not responsible                  for any belongings or valuables.               Contacts, dentures or bridgework may not be worn into surgery.  Leave suitcase in the car. After surgery it may be brought to your room.  For patients admitted to the hospital, discharge time is determined by your                treatment team.               Patients discharged the day of surgery will not be allowed to drive  home.    Special Instructions:  [facility] - Preparing for Surgery  Before surgery, you can play an important role.  Because skin is not sterile, your skin needs to be as free of germs as possible.  You can reduce the number of germs on you skin by washing with CHG (chlorahexidine gluconate) soap before surgery.  CHG is an antiseptic cleaner which kills germs and bonds with the skin to continue killing germs even after washing.  Please DO NOT use if you have an allergy to CHG or antibacterial soaps.  If your skin becomes reddened/irritated stop using the CHG and inform your nurse when you arrive at Short Stay.  Do not shave (including legs and underarms) for at least 48 hours prior to the first CHG shower.  You may shave your face.  Please  follow these instructions carefully:   1.  Shower with CHG Soap the night before surgery and the                                morning of Surgery.  2.  If you choose to wash your hair, wash your hair first as usual with your       normal shampoo.  3.  After you shampoo, rinse your hair and body thoroughly to remove the                      Shampoo.  4.  Use CHG as you would any other liquid soap.  You can apply chg directly       to the skin and wash gently with scrungie or a clean washcloth.  5.  Apply the CHG Soap to your body ONLY FROM THE NECK DOWN.        Do not use on open wounds or open sores.  Avoid contact with your eyes,  ears, mouth and genitals (private parts).  Wash genitals (private parts)       with your normal soap.  6.  Wash thoroughly, paying special attention to the area where your surgery        will be performed.  7.  Thoroughly rinse your body with warm water from the neck down.  8.  DO NOT shower/wash with your normal soap after using and rinsing off       the CHG Soap.  9.  Pat yourself dry with a clean towel.            10.  Wear clean pajamas.            11.  Place clean sheets on your bed the night of your first shower and do not        sleep with pets.  Day of Surgery  Do not apply any lotions/deoderants the morning of surgery.  Please wear clean clothes to the hospital/surgery center.     Please read over the following fact sheets that you were given: Pain Booklet and Coughing and Deep Breathing

## 2014-10-01 NOTE — Progress Notes (Addendum)
Anesthesia Chart Review:  Patient is a 74 year old male scheduled for video bronchoscopy with EBUS on 10/07/14 by Dr. Prescott Gum. He was recently found to have a LUL lung nodule during evaluation for CVA.  Needle biopsy confirmed SSC lung cancer.  His oncologist requested a transbronchial biopsy of a mildly hypermetabolic mediastinal node. If the LN in negative he would be referred for stereotatic radiation therapy.   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 PCP is Dr. Jenean Lindau.  HEM-ONC is Dr. [place] Sink.   09/30/14 EKG: NSR, possible septal infarct (age undetermined).   08/19/14 Carotid duplex [facility]): 50-69% stenosis bilateral ICA.  08/18/14 Brain MRI [facility]; scanned under Results Review tab): Acute non-hemorrhagic infarct involving the lateral right thalamus and posterior limb internal capsule. Remote white matter changes within the posterior coronal radiata bilaterally. The left mastoid effusion. No obstructing nasopharyngeal lesion is evident.  Preoperative labs noted.  Cr 0.93. Glucose 104. H/H 9.3/29.4, PLT count 600K. PT/PTT WNL. CBC results called to [facility] for review with surgeon.  He is for CXR on the day of surgery.   I requested last neurology notes, discharge summary, echo, EKG, labs from [facility] and/or [facility].  Anesthesia follow-up once received.  I did review currently available records with anesthesiologist Dr. Lissa Hoard due to recent CVA.  He is at increased risk for recurrent stroke particularly since CVA is less than 12 weeks ago; however, delay of procedure could delay treatment of his newly diagnosed lung cancer so would anticipate moving forward as planned.    Further input following receipt of records.  George Hugh [facility] Short Stay Center/Anesthesiology Phone [phone] 10/01/2014 2:29 PM  Addendum: Records from  [facility] received. 08/18/14 EKG appears similar.  He was not a candidate for TPA for his CVA due to late presentation.  H/H on 08/20/14 was 11.4/34.3.   08/18/14 Echo: Normal LV size and function, LVEF 65-70%, no segmental abnormality, diastolic filling pattern indicates impaired relaxation. Aortic sclerosis, but no significant valvular disease. Borderline, mild RV dilatation, normal systolic function. Trace TR. No intracardiac source for emboli identified.   Since his H/H have decreased since 08/2014, I'll order an ISTAT4 to re-evaluate his H/H to ensure stability.  George Hugh [facility] Short Stay Center/Anesthesiology Phone [phone] 10/05/2014 5:12 PM

## 2014-10-07 NOTE — Progress Notes (Signed)
Pt notified of time change and instructed pt to be here tomorrow at 5:30 AM. He states he lives a long way from here, but he'll try to make it here by 5:30.

## 2014-10-08 ENCOUNTER — Ambulatory Visit (HOSPITAL_COMMUNITY): Payer: Medicare Other

## 2014-10-08 ENCOUNTER — Encounter (HOSPITAL_COMMUNITY): Payer: Self-pay | Admitting: Anesthesiology

## 2014-10-08 ENCOUNTER — Encounter (HOSPITAL_COMMUNITY)
Admission: RE | Disposition: A | Discharge status: Home / Self Care | Payer: Self-pay | Source: Ambulatory Visit | Attending: Cardiothoracic Surgery

## 2014-10-08 ENCOUNTER — Ambulatory Visit: Payer: Medicare Other | Admitting: Cardiothoracic Surgery

## 2014-10-08 ENCOUNTER — Ambulatory Visit (HOSPITAL_COMMUNITY): Payer: Medicare Other | Admitting: Vascular Surgery

## 2014-10-08 ENCOUNTER — Ambulatory Visit (HOSPITAL_COMMUNITY)
Admission: RE | Admit: 2014-10-08 | Discharge: 2014-10-08 | Disposition: A | Discharge status: Home / Self Care | Payer: Medicare Other | Source: Ambulatory Visit | Attending: Cardiothoracic Surgery | Admitting: Cardiothoracic Surgery

## 2014-10-08 ENCOUNTER — Ambulatory Visit (HOSPITAL_COMMUNITY): Payer: Medicare Other | Admitting: Anesthesiology

## 2014-10-08 DIAGNOSIS — C3412 Malignant neoplasm of upper lobe, left bronchus or lung: Secondary | ICD-10-CM | POA: Diagnosis not present

## 2014-10-08 DIAGNOSIS — C3490 Malignant neoplasm of unspecified part of unspecified bronchus or lung: Secondary | ICD-10-CM | POA: Diagnosis not present

## 2014-10-08 DIAGNOSIS — R0602 Shortness of breath: Secondary | ICD-10-CM

## 2014-10-08 DIAGNOSIS — R05 Cough: Secondary | ICD-10-CM | POA: Diagnosis not present

## 2014-10-08 DIAGNOSIS — J449 Chronic obstructive pulmonary disease, unspecified: Secondary | ICD-10-CM | POA: Insufficient documentation

## 2014-10-08 DIAGNOSIS — M1990 Unspecified osteoarthritis, unspecified site: Secondary | ICD-10-CM | POA: Diagnosis not present

## 2014-10-08 DIAGNOSIS — I10 Essential (primary) hypertension: Secondary | ICD-10-CM | POA: Insufficient documentation

## 2014-10-08 DIAGNOSIS — J984 Other disorders of lung: Secondary | ICD-10-CM | POA: Diagnosis not present

## 2014-10-08 DIAGNOSIS — Z87891 Personal history of nicotine dependence: Secondary | ICD-10-CM | POA: Diagnosis not present

## 2014-10-08 HISTORY — PX: VIDEO BRONCHOSCOPY WITH ENDOBRONCHIAL ULTRASOUND: SHX6177

## 2014-10-08 LAB — POCT I-STAT 4, (NA,K, GLUC, HGB,HCT)
Glucose, Bld: 132 mg/dL — ABNORMAL HIGH (ref 70–99)
HCT: 33 % — ABNORMAL LOW (ref 39.0–52.0)
Hemoglobin: 11.2 g/dL — ABNORMAL LOW (ref 13.0–17.0)
Potassium: 3.6 mmol/L (ref 3.5–5.1)
Sodium: 137 mmol/L (ref 135–145)

## 2014-10-08 SURGERY — BRONCHOSCOPY, WITH EBUS
Anesthesia: General

## 2014-10-08 MED ORDER — PHENYLEPHRINE HCL 10 MG/ML IJ SOLN
INTRAMUSCULAR | Status: DC | PRN
  Administered 2014-10-08 (×3): 80 ug via INTRAVENOUS

## 2014-10-08 MED ORDER — FENTANYL CITRATE (PF) 100 MCG/2ML IJ SOLN
INTRAMUSCULAR | Status: DC | PRN
  Administered 2014-10-08 (×3): 50 ug via INTRAVENOUS

## 2014-10-08 MED ORDER — ONDANSETRON HCL 4 MG/2ML IJ SOLN
INTRAMUSCULAR | Status: DC | PRN
  Administered 2014-10-08: 4 mg via INTRAVENOUS

## 2014-10-08 MED ORDER — ARTIFICIAL TEARS OP OINT
TOPICAL_OINTMENT | OPHTHALMIC | Status: AC
  Filled 2014-10-08: qty 3.5

## 2014-10-08 MED ORDER — SUCCINYLCHOLINE CHLORIDE 20 MG/ML IJ SOLN
INTRAMUSCULAR | Status: AC
  Filled 2014-10-08: qty 1

## 2014-10-08 MED ORDER — NEOSTIGMINE METHYLSULFATE 10 MG/10ML IV SOLN
INTRAVENOUS | Status: AC
  Filled 2014-10-08: qty 2

## 2014-10-08 MED ORDER — ACETAMINOPHEN 650 MG RE SUPP: 650.0000 mg | RECTAL | Status: DC | PRN

## 2014-10-08 MED ORDER — PROPOFOL 10 MG/ML IV BOLUS
INTRAVENOUS | Status: AC
  Filled 2014-10-08: qty 20

## 2014-10-08 MED ORDER — PHENYLEPHRINE 40 MCG/ML (10ML) SYRINGE FOR IV PUSH (FOR BLOOD PRESSURE SUPPORT)
PREFILLED_SYRINGE | INTRAVENOUS | Status: AC
  Filled 2014-10-08: qty 10

## 2014-10-08 MED ORDER — EPHEDRINE SULFATE 50 MG/ML IJ SOLN
INTRAMUSCULAR | Status: AC
  Filled 2014-10-08: qty 1

## 2014-10-08 MED ORDER — ARTIFICIAL TEARS OP OINT
TOPICAL_OINTMENT | OPHTHALMIC | Status: DC | PRN
  Administered 2014-10-08: 1 via OPHTHALMIC

## 2014-10-08 MED ORDER — OXYCODONE HCL 5 MG PO TABS: 5.0000 mg | ORAL_TABLET | Freq: Once | ORAL | Status: DC | PRN

## 2014-10-08 MED ORDER — LACTATED RINGERS IV SOLN
INTRAVENOUS | Status: DC | PRN
  Administered 2014-10-08 (×2): via INTRAVENOUS

## 2014-10-08 MED ORDER — CEFAZOLIN SODIUM 1-5 GM-% IV SOLN
INTRAVENOUS | Status: DC | PRN
  Administered 2014-10-08: 2 g via INTRAVENOUS

## 2014-10-08 MED ORDER — ONDANSETRON HCL 4 MG/2ML IJ SOLN: 4.0000 mg | Freq: Once | INTRAMUSCULAR | Status: DC | PRN

## 2014-10-08 MED ORDER — ACETAMINOPHEN 325 MG PO TABS: 650.0000 mg | ORAL_TABLET | ORAL | Status: DC | PRN

## 2014-10-08 MED ORDER — SODIUM CHLORIDE 0.9 % IJ SOLN: 3.0000 mL | INTRAMUSCULAR | Status: DC | PRN

## 2014-10-08 MED ORDER — OXYCODONE HCL 5 MG/5ML PO SOLN: 5.0000 mg | Freq: Once | ORAL | Status: DC | PRN

## 2014-10-08 MED ORDER — MIDAZOLAM HCL 2 MG/2ML IJ SOLN
INTRAMUSCULAR | Status: AC
  Filled 2014-10-08: qty 2

## 2014-10-08 MED ORDER — PHENYLEPHRINE HCL 10 MG/ML IJ SOLN
INTRAMUSCULAR | Status: AC
  Filled 2014-10-08: qty 1

## 2014-10-08 MED ORDER — MIDAZOLAM HCL 5 MG/5ML IJ SOLN
INTRAMUSCULAR | Status: DC | PRN
  Administered 2014-10-08: 2 mg via INTRAVENOUS

## 2014-10-08 MED ORDER — ACETAMINOPHEN 500 MG PO TABS: 1000.0000 mg | ORAL_TABLET | Freq: Four times a day (QID) | ORAL | Status: DC

## 2014-10-08 MED ORDER — VECURONIUM BROMIDE 10 MG IV SOLR
INTRAVENOUS | Status: AC
  Filled 2014-10-08: qty 10

## 2014-10-08 MED ORDER — ROCURONIUM BROMIDE 50 MG/5ML IV SOLN
INTRAVENOUS | Status: AC
  Filled 2014-10-08: qty 1

## 2014-10-08 MED ORDER — SODIUM CHLORIDE 0.9 % IV SOLN: 250.0000 mL | INTRAVENOUS | Status: DC | PRN

## 2014-10-08 MED ORDER — NEOSTIGMINE METHYLSULFATE 10 MG/10ML IV SOLN
INTRAVENOUS | Status: DC | PRN
  Administered 2014-10-08: 4 mg via INTRAVENOUS

## 2014-10-08 MED ORDER — LIDOCAINE HCL (CARDIAC) 20 MG/ML IV SOLN
INTRAVENOUS | Status: DC | PRN
  Administered 2014-10-08: 50 mg via INTRAVENOUS

## 2014-10-08 MED ORDER — SODIUM CHLORIDE 0.9 % IV SOLN
10.0000 mg | INTRAVENOUS | Status: DC | PRN
  Administered 2014-10-08: 25 ug/min via INTRAVENOUS

## 2014-10-08 MED ORDER — FENTANYL CITRATE (PF) 100 MCG/2ML IJ SOLN: 25.0000 ug | INTRAMUSCULAR | Status: DC | PRN

## 2014-10-08 MED ORDER — FENTANYL CITRATE (PF) 250 MCG/5ML IJ SOLN
INTRAMUSCULAR | Status: AC
  Filled 2014-10-08: qty 5

## 2014-10-08 MED ORDER — OXYCODONE HCL 5 MG PO TABS: 5.0000 mg | ORAL_TABLET | ORAL | Status: DC | PRN

## 2014-10-08 MED ORDER — LIDOCAINE HCL (CARDIAC) 20 MG/ML IV SOLN
INTRAVENOUS | Status: AC
  Filled 2014-10-08: qty 5

## 2014-10-08 MED ORDER — ROCURONIUM BROMIDE 100 MG/10ML IV SOLN
INTRAVENOUS | Status: DC | PRN
  Administered 2014-10-08: 10 mg via INTRAVENOUS
  Administered 2014-10-08: 5 mg via INTRAVENOUS
  Administered 2014-10-08: 35 mg via INTRAVENOUS

## 2014-10-08 MED ORDER — STERILE WATER FOR INJECTION IJ SOLN
INTRAMUSCULAR | Status: AC
  Filled 2014-10-08: qty 10

## 2014-10-08 MED ORDER — PROPOFOL 10 MG/ML IV BOLUS
INTRAVENOUS | Status: DC | PRN
  Administered 2014-10-08: 100 mg via INTRAVENOUS

## 2014-10-08 MED ORDER — GLYCOPYRROLATE 0.2 MG/ML IJ SOLN
INTRAMUSCULAR | Status: DC | PRN
  Administered 2014-10-08: 0.6 mg via INTRAVENOUS

## 2014-10-08 MED ORDER — DEXAMETHASONE SODIUM PHOSPHATE 10 MG/ML IJ SOLN
INTRAMUSCULAR | Status: DC | PRN
  Administered 2014-10-08: 10 mg via INTRAVENOUS

## 2014-10-08 MED ORDER — SODIUM CHLORIDE 0.9 % IJ SOLN: 3.0000 mL | Freq: Two times a day (BID) | INTRAMUSCULAR | Status: DC

## 2014-10-08 MED ORDER — ONDANSETRON HCL 4 MG/2ML IJ SOLN
INTRAMUSCULAR | Status: AC
  Filled 2014-10-08: qty 2

## 2014-10-08 MED ORDER — 0.9 % SODIUM CHLORIDE (POUR BTL) OPTIME
TOPICAL | Status: DC | PRN
  Administered 2014-10-08: 1000 mL

## 2014-10-08 SURGICAL SUPPLY — 29 items
BRUSH CYTOL CELLEBRITY 1.5X140 (MISCELLANEOUS) IMPLANT
CANISTER SUCTION 2500CC (MISCELLANEOUS) ×3 IMPLANT
CONT SPEC 4OZ CLIKSEAL STRL BL (MISCELLANEOUS) ×3 IMPLANT
COTTONBALL LRG STERILE PKG (GAUZE/BANDAGES/DRESSINGS) IMPLANT
COVER TABLE BACK 60X90 (DRAPES) ×3 IMPLANT
DRSG AQUACEL AG ADV 3.5X14 (GAUZE/BANDAGES/DRESSINGS) IMPLANT
FORCEPS BIOP RJ4 1.8 (CUTTING FORCEPS) IMPLANT
GAUZE SPONGE 4X4 12PLY STRL (GAUZE/BANDAGES/DRESSINGS) ×3 IMPLANT
GLOVE BIO SURGEON STRL SZ7.5 (GLOVE) ×3 IMPLANT
GLOVE BIOGEL PI IND STRL 7.0 (GLOVE) ×1 IMPLANT
GLOVE BIOGEL PI INDICATOR 7.0 (GLOVE) ×2
KIT CLEAN ENDO COMPLIANCE (KITS) ×6 IMPLANT
KIT ROOM TURNOVER OR (KITS) ×3 IMPLANT
MARKER SKIN DUAL TIP RULER LAB (MISCELLANEOUS) ×3 IMPLANT
NEEDLE 22X1 1/2 (OR ONLY) (NEEDLE) IMPLANT
NEEDLE BIOPSY TRANSBRONCH 21G (NEEDLE) IMPLANT
NEEDLE BLUNT 18X1 FOR OR ONLY (NEEDLE) IMPLANT
NEEDLE SYS SONOTIP II EBUSTBNA (NEEDLE) ×3 IMPLANT
NS IRRIG 1000ML POUR BTL (IV SOLUTION) ×3 IMPLANT
OIL SILICONE PENTAX (PARTS (SERVICE/REPAIRS)) ×3 IMPLANT
PAD ARMBOARD 7.5X6 YLW CONV (MISCELLANEOUS) ×6 IMPLANT
SYR 20CC LL (SYRINGE) ×3 IMPLANT
SYR 20ML ECCENTRIC (SYRINGE) ×3 IMPLANT
SYR 5ML LUER SLIP (SYRINGE) IMPLANT
SYR CONTROL 10ML LL (SYRINGE) IMPLANT
TOWEL OR 17X24 6PK STRL BLUE (TOWEL DISPOSABLE) ×3 IMPLANT
TRAP SPECIMEN MUCOUS 40CC (MISCELLANEOUS) ×3 IMPLANT
TUBE CONNECTING 20'X1/4 (TUBING) ×2
TUBE CONNECTING 20X1/4 (TUBING) ×4 IMPLANT

## 2014-10-08 NOTE — Op Note (Signed)
[name], [name] NO.:  [id number]  MEDICAL RECORD NO.:  [id number]  LOCATION:  [facility]                         FACILITY:  [facility]  PHYSICIAN:  Ivin Poot, M.D.  DATE OF BIRTH:  1940/09/01  DATE OF PROCEDURE:  10/08/2014 DATE OF DISCHARGE:  10/08/2014                              OPERATIVE REPORT   OPERATION: 1. Video bronchoscopy. 2. Endobronchial ultrasound directed needle aspiration of mediastinal     lymph node 4R.  PREOPERATIVE DIAGNOSIS:  Biopsy-proven non-small cell carcinoma of the left lung.  POSTOPERATIVE DIAGNOSIS:  Biopsy-proven non-small cell carcinoma of the left lung.  SURGEON:  Ivin Poot, M.D.  ANESTHESIA:  General.  INDICATIONS:  The patient is a 74 year old gentleman who was diagnosed with a left upper lobe non-small cell carcinoma of the lung.  PET scan showed a small precarinal node in the 4R station which had mild activity.  He still remains an active smoker.  Before he underwent stereotactic radiation therapy to the primary tumor biopsy of the lymph nodes was requested by his oncologist, Dr. Bobby Rumpf.  There was no other evidence of metastatic involvement.  I discussed the procedure of bronchoscopy and EBUS with the patient and he understood the indications, benefits, alternatives, and risks and agreed to proceed.  DESCRIPTION OF PROCEDURE:  The patient was brought to the operating room and placed supine on the operating room table, where general anesthesia was induced.  Through the endotracheal tube, a video bronchoscope was passed.  The distal trachea and carina were normal.  The left mainstem bronchus and the endobronchial segments left upper lobe and left lower lobe were visualized and there were no abnormalities noted.  The bronchoscope was passed on the right mainstem bronchus.  The endobronchial segments of the right upper lobe, right middle lobe, and right lower lobe were all visualized and there were no  endobronchial lesions noted.  The bronchoscope was then exchanged for the EBUS scope.  Using the transbronchial ultrasound, the 4R lymph node station was identified. Several transbronchial needle aspirates of the station were performed. Some were sent for quick prep and others were sent for permanent fixation.  After the biopsies were performed, there was no significant airway bleeding.  The bronchoscope was withdrawn.  The quick prep cytology on the initial aspirates was negative for cancer.  The final pathology will be pending in the patient and his oncologist will be notified.  The patient returned to the recovery room. His chest x-ray showed no acute findings.     Ivin Poot, M.D.     PV/MEDQ  D:  10/08/2014  T:  10/08/2014  Job:  [id number]

## 2014-10-08 NOTE — Transfer of Care (Signed)
Immediate Anesthesia Transfer of Care Note  Patient: David Branch  Procedure(s) Performed: Procedure(s): VIDEO BRONCHOSCOPY WITH ENDOBRONCHIAL ULTRASOUND (N/A)  Patient Location: PACU  Anesthesia Type:General  Level of Consciousness: awake, alert  and oriented  Airway & Oxygen Therapy: Patient Spontanous Breathing and Patient connected to nasal cannula oxygen  Post-op Assessment: Report given to RN, Post -op Vital signs reviewed and stable and Patient moving all extremities X 4  Post vital signs: Reviewed and stable  Last Vitals:  Filed Vitals:   10/08/14 0557  BP: 193/71  Pulse:   Temp:   Resp:     Complications: No apparent anesthesia complications

## 2014-10-08 NOTE — Anesthesia Procedure Notes (Signed)
Procedure Name: Intubation Date/Time: 10/08/2014 7:32 AM Performed by: Neldon Newport Pre-anesthesia Checklist: Patient being monitored, Suction available, Emergency Drugs available, Patient identified and Timeout performed Patient Re-evaluated:Patient Re-evaluated prior to inductionOxygen Delivery Method: Circle system utilized Preoxygenation: Pre-oxygenation with 100% oxygen Intubation Type: IV induction Ventilation: Mask ventilation without difficulty Laryngoscope Size: Mac and 3 Grade View: Grade I Tube type: Oral Tube size: 8.5 mm Number of attempts: 1 Placement Confirmation: positive ETCO2,  ETT inserted through vocal cords under direct vision and breath sounds checked- equal and bilateral Secured at: 23 cm Tube secured with: Tape Dental Injury: Teeth and Oropharynx as per pre-operative assessment

## 2014-10-08 NOTE — Discharge Instructions (Signed)
No lifting over 20 pounds You may cough up small amounts of blood May resume regular activity tomorrow Resume all home medications as schedule. No follow up appointment Dr. Prescott Gum will call tomorrow or Monday with results

## 2014-10-08 NOTE — Anesthesia Preprocedure Evaluation (Addendum)
Anesthesia Evaluation  Patient identified by MRN, date of birth, ID band Patient awake    Reviewed: Allergy & Precautions, NPO status , Patient's Chart, lab work & pertinent test results  Airway Mallampati: II  TM Distance: >3 FB Neck ROM: Full    Dental  (+) Partial Upper, Dental Advisory Given   Pulmonary former smoker,  + rhonchi   + decreased breath sounds      Cardiovascular hypertension, Rhythm:Regular Rate:Normal     Neuro/Psych    GI/Hepatic   Endo/Other    Renal/GU      Musculoskeletal   Abdominal   Peds  Hematology   Anesthesia Other Findings   Reproductive/Obstetrics                            Anesthesia Physical Anesthesia Plan  ASA: III  Anesthesia Plan:    Post-op Pain Management:    Induction: Intravenous  Airway Management Planned: Oral ETT  Additional Equipment:   Intra-op Plan:   Post-operative Plan: Extubation in OR  Informed Consent: I have reviewed the patients History and Physical, chart, labs and discussed the procedure including the risks, benefits and alternatives for the proposed anesthesia with the patient or authorized representative who has indicated his/her understanding and acceptance.   Dental advisory given and Dental Advisory Given  Plan Discussed with: CRNA and Anesthesiologist  Anesthesia Plan Comments:        Anesthesia Quick Evaluation

## 2014-10-08 NOTE — Progress Notes (Signed)
The patient was examined and preop studies reviewed. There has been no change from the prior exam and the patient is ready for surgery.   Plan bronchoscopy, EBUS on R [place]

## 2014-10-08 NOTE — Anesthesia Postprocedure Evaluation (Signed)
  Anesthesia Post-op Note  Patient: David Branch  Procedure(s) Performed: Procedure(s): VIDEO BRONCHOSCOPY WITH ENDOBRONCHIAL ULTRASOUND (N/A)  Patient Location: PACU  Anesthesia Type:General  Level of Consciousness: awake, alert  and oriented  Airway and Oxygen Therapy: Patient Spontanous Breathing and Patient connected to nasal cannula oxygen  Post-op Pain: mild  Post-op Assessment: Post-op Vital signs reviewed, Patient's Cardiovascular Status Stable, Respiratory Function Stable, Patent Airway and Pain level controlled  Post-op Vital Signs: stable  Last Vitals:  Filed Vitals:   10/08/14 1007  BP: 156/63  Pulse: 76  Temp:   Resp:     Complications: No apparent anesthesia complications

## 2014-10-08 NOTE — Brief Op Note (Signed)
10/08/2014  9:19 AM  PATIENT:  David Branch  74 y.o. male  PRE-OPERATIVE DIAGNOSIS:  LUNG CANCER  POST-OPERATIVE DIAGNOSIS:  lung cancer  PROCEDURE:  Procedure(s): VIDEO BRONCHOSCOPY WITH ENDOBRONCHIAL ULTRASOUND (N/A)  SURGEON:  Surgeon(s) and Role:    * Ivin Poot, MD - Primary  PHYSICIAN ASSISTANT:   ASSISTANTS: none   ANESTHESIA:   general  EBL:  Total I/O In: 1000 [I.V.:1000] Out: -   BLOOD ADMINISTERED:none  DRAINS: none   LOCAL MEDICATIONS USED:  NONE  SPECIMEN:  Aspirate  DISPOSITION OF SPECIMEN:  PATHOLOGY  COUNTS:  YES  TOURNIQUET:  * No tourniquets in log *  DICTATION: .Dragon Dictation  PLAN OF CARE: Discharge to home after PACU  PATIENT DISPOSITION:  PACU - hemodynamically stable.   Delay start of Pharmacological VTE agent (>24hrs) due to surgical blood loss or risk of bleeding: yes

## 2014-10-09 ENCOUNTER — Encounter (HOSPITAL_COMMUNITY): Payer: Self-pay | Admitting: Cardiothoracic Surgery

## 2014-10-15 DIAGNOSIS — D649 Anemia, unspecified: Secondary | ICD-10-CM | POA: Diagnosis not present

## 2014-10-15 DIAGNOSIS — C3412 Malignant neoplasm of upper lobe, left bronchus or lung: Secondary | ICD-10-CM | POA: Diagnosis not present

## 2014-10-20 DIAGNOSIS — C4442 Squamous cell carcinoma of skin of scalp and neck: Secondary | ICD-10-CM | POA: Diagnosis not present

## 2014-10-20 DIAGNOSIS — L570 Actinic keratosis: Secondary | ICD-10-CM | POA: Diagnosis not present
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
## 2014-10-21 NOTE — Progress Notes (Signed)
Radiation Oncology         [phone]) [phone] ________________________________  Initial outpatient Consultation - Date: 10/22/2014   Name: David Branch MRN: [id number]   DOB: 1940/12/15  REFERRING PHYSICIAN: Marice Potter, MD  DIAGNOSIS AND STAGE: No matching staging information was found for the patient. 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 male  Who had a stroke about 2 months ago. A chest xray perform at that time showed an area of abnormality in the left upper lobe. There was also a 6 mm nodule in the right lower lobe. A subsequent PET scan was performed which showed activity in the left upper lobe legion with SUV of 7. The subcarinal left lymph node had an SUV of 2.3. No other area of metastatic disease was noted. MRI of brain was negative for metastatic disease. Had a biopsy of left upper lobe legion which showed squamous cell carcinoma. He also underwent a EBUS of the subcarinal lymph node which was negative for malignancy. He is not a surgical candidate. He continues to smoke. He has a 30 lbs weight loss over the past 6 months.  He is unaccompanied today .  PREVIOUS RADIATION THERAPY: No  Past medical, social and family history were reviewed in the electronic chart. Review of symptoms was reviewed in the electronic chart. Medications were reviewed in the electronic chart.   PHYSICAL EXAM:  Filed Vitals:   10/22/14 1425  BP: 160/68  Pulse: 74  Temp: 97.4 F (36.3 C)  Resp: 20  .147 lb 3.2 oz (66.769 kg). Pleasant, thin male in no distress.  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 We discussed that he may fail in the mediastinum hilum or elsewhere in the body and radiation was a local only process. We discussed the process of simulation and the use of a pad all to  decrease respiratory motion. We discussed the use of 4-dimensional simulation to minimize normal lung tissue treated and the use of respiratory compression. We discussed 3- 5 treatments occurring every other day as an outpatient. We discussed these treatments will last about 10-20 minutes and he would be here at the hospital for about an hour. We discussed that SBRT was unlikely to make his breathing any worse. It is also unlikely to make his breathing symptoms any better. We discussed possible side effects including his shoulder pain due to arm positioning and possible rib fracture withthe pleural-based nodules proximity to the ribs. We discussed damage to other critical normal structures including heart, ribs, lung collapse, chronic cough, and brachial plexus injury. We discussed that without treatment this  could develop into a more aggressive or even metastatic cancer. He has signed informed consent and agreed to proceed forward. We scheduled him for simulation next Tuesday.   I spent 40 minutes face to face with the patient and more than 50% of that time was spent in counseling and/or coordination of care.  This document serves as a record of services personally performed by Thea Silversmith, MD. It was created on her behalf by Arlyce Harman, a trained medical scribe. The creation of this record is based on the scribe's personal observations and the provider's statements to them. This document has been checked and approved by the attending provider.      ------------------------------------------------  Thea Silversmith, MD

## 2014-10-22 ENCOUNTER — Ambulatory Visit
Admission: RE | Admit: 2014-10-22 | Discharge: 2014-10-22 | Disposition: A | Discharge status: Home / Self Care | Payer: Medicare Other | Source: Ambulatory Visit | Attending: Radiation Oncology | Admitting: Radiation Oncology

## 2014-10-22 ENCOUNTER — Encounter: Payer: Self-pay | Admitting: Radiation Oncology

## 2014-10-22 VITALS — BP 160/68 | HR 74 | Temp 97.4°F | Resp 20 | Ht 69.0 in | Wt 147.2 lb

## 2014-10-22 DIAGNOSIS — C3412 Malignant neoplasm of upper lobe, left bronchus or lung: Secondary | ICD-10-CM | POA: Insufficient documentation

## 2014-10-22 DIAGNOSIS — Z8673 Personal history of transient ischemic attack (TIA), and cerebral infarction without residual deficits: Secondary | ICD-10-CM | POA: Insufficient documentation

## 2014-10-22 DIAGNOSIS — Z510 Encounter for antineoplastic radiation therapy: Secondary | ICD-10-CM | POA: Diagnosis not present

## 2014-10-22 NOTE — Progress Notes (Signed)
Please see the Nurse Progress Note in the MD Initial Consult Encounter for this patient. 

## 2014-10-27 ENCOUNTER — Ambulatory Visit
Admission: RE | Admit: 2014-10-27 | Discharge: 2014-10-27 | Disposition: A | Discharge status: Home / Self Care | Payer: Medicare Other | Source: Ambulatory Visit | Attending: Radiation Oncology | Admitting: Radiation Oncology

## 2014-10-27 ENCOUNTER — Ambulatory Visit: Payer: Medicare Other | Admitting: Radiation Oncology

## 2014-10-27 DIAGNOSIS — Z8673 Personal history of transient ischemic attack (TIA), and cerebral infarction without residual deficits: Secondary | ICD-10-CM | POA: Diagnosis not present

## 2014-10-27 DIAGNOSIS — C3412 Malignant neoplasm of upper lobe, left bronchus or lung: Secondary | ICD-10-CM

## 2014-10-27 DIAGNOSIS — Z510 Encounter for antineoplastic radiation therapy: Secondary | ICD-10-CM | POA: Diagnosis not present

## 2014-10-27 DIAGNOSIS — C3410 Malignant neoplasm of upper lobe, unspecified bronchus or lung: Secondary | ICD-10-CM | POA: Diagnosis not present

## 2014-10-27 NOTE — Progress Notes (Signed)
[facility] Simulation and Treatment Planning Note   Name: David Branch MRN: [id number]  Date: 10/27/2014  DOB: 07/15/1940   DIAGNOSIS: The encounter diagnosis was Malignant neoplasm of upper lobe of left lung.  CONSENT VERIFIED: yes  SET UP AND IMMOBILIZATION: Patient is setup supine in a vac loc with a custom moldable pillow for head and neck immobilization   NARRATIVE: The patient was brought to the [place].  Identity was confirmed.  All relevant records and images related to the planned course of therapy were reviewed.  Then, the patient was positioned in a stable reproducible clinical set-up for radiation therapy.  CT images were obtained.  Skin markings were placed.  A four dimensional simulation was then performed to track tumor movement throughout the patients' breathing cycle. The CT images were loaded into the planning software where the target and avoidance structures were contoured.  The GTV was outlined on the free breathing, 4D and MIP image sets.  The radiation prescription was entered and confirmed.   TREATMENT PLANNING NOTE:  Treatment planning then occurred. I have requested 3D simulation with [facility] of the spinal cord, total lungs and gross tumor volume. I have also requested mlcs and an isodose plan.   Special treatment procedure will be performed as [name] will be receiving high dose per fraction.    A total of 5 complex treatment devices will be used

## 2014-10-29 ENCOUNTER — Ambulatory Visit: Payer: Medicare Other | Admitting: Radiation Oncology

## 2014-10-29 ENCOUNTER — Ambulatory Visit: Payer: Medicare Other

## 2014-10-30 DIAGNOSIS — E539 Vitamin B deficiency, unspecified: Secondary | ICD-10-CM | POA: Diagnosis not present

## 2014-11-03 DIAGNOSIS — Z8673 Personal history of transient ischemic attack (TIA), and cerebral infarction without residual deficits: Secondary | ICD-10-CM | POA: Diagnosis not present

## 2014-11-03 DIAGNOSIS — C3412 Malignant neoplasm of upper lobe, left bronchus or lung: Secondary | ICD-10-CM | POA: Diagnosis not present

## 2014-11-03 DIAGNOSIS — Z510 Encounter for antineoplastic radiation therapy: Secondary | ICD-10-CM | POA: Diagnosis not present

## 2014-11-03 DIAGNOSIS — C3410 Malignant neoplasm of upper lobe, unspecified bronchus or lung: Secondary | ICD-10-CM | POA: Diagnosis not present

## 2014-11-09 ENCOUNTER — Ambulatory Visit: Payer: Medicare Other | Admitting: Radiation Oncology

## 2014-11-10 ENCOUNTER — Ambulatory Visit
Admission: RE | Admit: 2014-11-10 | Discharge: 2014-11-10 | Disposition: A | Discharge status: Home / Self Care | Payer: Medicare Other | Source: Ambulatory Visit | Attending: Radiation Oncology | Admitting: Radiation Oncology

## 2014-11-10 VITALS — BP 144/76 | HR 78 | Temp 97.5°F | Resp 20 | Wt 144.6 lb

## 2014-11-10 DIAGNOSIS — C3412 Malignant neoplasm of upper lobe, left bronchus or lung: Secondary | ICD-10-CM | POA: Diagnosis not present

## 2014-11-10 DIAGNOSIS — C3410 Malignant neoplasm of upper lobe, unspecified bronchus or lung: Secondary | ICD-10-CM | POA: Diagnosis not present

## 2014-11-10 DIAGNOSIS — Z8673 Personal history of transient ischemic attack (TIA), and cerebral infarction without residual deficits: Secondary | ICD-10-CM | POA: Diagnosis not present

## 2014-11-10 DIAGNOSIS — Z510 Encounter for antineoplastic radiation therapy: Secondary | ICD-10-CM | POA: Diagnosis not present

## 2014-11-10 NOTE — Progress Notes (Signed)
  Radiation Oncology         [phone] ________________________________  Name: David Branch MRN: [id number]  Date: 11/10/2014  DOB: 1941-02-16  Stereotactic Body Radiotherapy Treatment Procedure Note  NARRATIVE:  David Branch was brought to the stereotactic radiation treatment machine and placed supine on the CT couch. The patient was set up for stereotactic body radiotherapy on the body fix pillow.  3D TREATMENT PLANNING AND DOSIMETRY:  The patient's radiation plan was reviewed and approved prior to starting treatment.  It showed 3-dimensional radiation distributions overlaid onto the planning CT.  The [facility] for the target structures as well as the organs at risk were reviewed. The documentation of this is filed in the radiation oncology EMR.  SIMULATION VERIFICATION:  The patient underwent CT imaging on the treatment unit.  These were carefully aligned to document that the ablative radiation dose would cover the target volume and maximally spare the nearby organs at risk according to the planned distribution.  SPECIAL TREATMENT PROCEDURE: David Branch received high dose ablative stereotactic body radiotherapy to the planned target volume without unforeseen complications. Treatment was delivered uneventfully. The high doses associated with stereotactic body radiotherapy and the significant potential risks require careful treatment set up and patient monitoring constituting a special treatment procedure   STEREOTACTIC TREATMENT MANAGEMENT:  Following delivery, the patient was evaluated clinically. The patient tolerated treatment without significant acute effects, and was discharged to home in stable condition.    PLAN: Continue treatment as planned.  _________________________   Thea Silversmith, MD

## 2014-11-10 NOTE — Progress Notes (Signed)
Weekly assessment of SBRT radiation to left upper lung.Denies pain.Has shortness of breath greatest on exertion.NP cough.Reviewed schedule and routine of clinic.Side effects mainly fatigue.

## 2014-11-10 NOTE — Progress Notes (Signed)
Weekly Management Note Current Dose: 18  Gy  Projected Dose: 54 Gy   Narrative:  The patient presents for routine under treatment assessment.  CBCT/MVCT images/Port film x-rays were reviewed.  The chart was checked. Doing well. No complaints.   Physical Findings: Weight: 144 lb 9.6 oz (65.59 kg). Unchanged  Impression:  The patient is tolerating radiation.  Plan:  Continue treatment as planned.

## 2014-11-11 ENCOUNTER — Ambulatory Visit: Payer: Medicare Other

## 2014-11-12 ENCOUNTER — Ambulatory Visit: Payer: Medicare Other | Admitting: Radiation Oncology

## 2014-11-13 ENCOUNTER — Ambulatory Visit: Payer: Medicare Other

## 2014-11-13 ENCOUNTER — Ambulatory Visit
Admission: RE | Admit: 2014-11-13 | Discharge: 2014-11-13 | Disposition: A | Discharge status: Home / Self Care | Payer: Medicare Other | Source: Ambulatory Visit | Attending: Radiation Oncology | Admitting: Radiation Oncology

## 2014-11-13 DIAGNOSIS — Z510 Encounter for antineoplastic radiation therapy: Secondary | ICD-10-CM | POA: Diagnosis not present

## 2014-11-13 DIAGNOSIS — C3412 Malignant neoplasm of upper lobe, left bronchus or lung: Secondary | ICD-10-CM | POA: Diagnosis not present

## 2014-11-13 DIAGNOSIS — Z8673 Personal history of transient ischemic attack (TIA), and cerebral infarction without residual deficits: Secondary | ICD-10-CM | POA: Diagnosis not present

## 2014-11-13 NOTE — Progress Notes (Signed)
   Radiation Oncology         [phone] ________________________________  Name: David Branch MRN: [id number]  Date: 11/13/2014  DOB: April 23, 1941  Stereotactic Body Radiotherapy Treatment Procedure Note   NARRATIVE: David Branch was brought to the stereotactic radiation treatment machine and placed supine on the CT couch. The patient was set up for stereotactic body radiotherapy on the body fix device.   3D TREATMENT PLANNING AND DOSIMETRY: The patient's radiation plan was reviewed and approved prior to starting treatment. It showed 3-dimensional radiation distributions overlaid onto the planning CT. The [facility] for the target structures as well as the organs at risk were reviewed. The documentation of this is filed in the radiation oncology EMR.   SIMULATION VERIFICATION: The patient underwent CT imaging on the treatment unit. These were carefully aligned to document that the ablative radiation dose would cover the target volume and maximally spare the nearby organs at risk according to the planned distribution.   SPECIAL TREATMENT PROCEDURE: David Branch received high dose ablative stereotactic body radiotherapy to the planned target volume without unforeseen complications. Treatment was delivered uneventfully. The high doses associated with stereotactic body radiotherapy and the significant potential risks require careful treatment set up and patient monitoring constituting a special treatment procedure.   STEREOTACTIC TREATMENT MANAGEMENT: Following delivery, the patient was evaluated clinically. The patient tolerated treatment without significant acute effects, and was discharged to home in stable condition.   Fraction: 2  Dose:  36 Gy  PLAN: Continue treatment as planned.   ________________________________  Jodelle Gross, MD, PhD

## 2014-11-16 ENCOUNTER — Ambulatory Visit: Payer: Medicare Other | Admitting: Radiation Oncology

## 2014-11-17 ENCOUNTER — Ambulatory Visit
Admission: RE | Admit: 2014-11-17 | Discharge: 2014-11-17 | Disposition: A | Discharge status: Home / Self Care | Payer: Medicare Other | Source: Ambulatory Visit | Attending: Radiation Oncology | Admitting: Radiation Oncology

## 2014-11-17 ENCOUNTER — Encounter: Payer: Self-pay | Admitting: Radiation Oncology

## 2014-11-17 ENCOUNTER — Ambulatory Visit: Payer: Medicare Other

## 2014-11-17 DIAGNOSIS — Z8673 Personal history of transient ischemic attack (TIA), and cerebral infarction without residual deficits: Secondary | ICD-10-CM | POA: Diagnosis not present

## 2014-11-17 DIAGNOSIS — C3412 Malignant neoplasm of upper lobe, left bronchus or lung: Secondary | ICD-10-CM

## 2014-11-17 DIAGNOSIS — Z510 Encounter for antineoplastic radiation therapy: Secondary | ICD-10-CM | POA: Diagnosis not present

## 2014-11-17 NOTE — Progress Notes (Signed)
  Radiation Oncology         [phone] ________________________________  Name: David Branch MRN: [id number]  Date: 11/17/2014  DOB: 24-Nov-1940  Stereotactic Body Radiotherapy Treatment Procedure Note  NARRATIVE:  David Branch was brought to the stereotactic radiation treatment machine and placed supine on the CT couch. The patient was set up for stereotactic body radiotherapy on the body fix pillow.  3D TREATMENT PLANNING AND DOSIMETRY:  The patient's radiation plan was reviewed and approved prior to starting treatment.  It showed 3-dimensional radiation distributions overlaid onto the planning CT.  The [facility] for the target structures as well as the organs at risk were reviewed. The documentation of this is filed in the radiation oncology EMR.  SIMULATION VERIFICATION:  The patient underwent CT imaging on the treatment unit.  These were carefully aligned to document that the ablative radiation dose would cover the target volume and maximally spare the nearby organs at risk according to the planned distribution.  SPECIAL TREATMENT PROCEDURE: David Branch received high dose ablative stereotactic body radiotherapy to the planned target volume without unforeseen complications. Treatment was delivered uneventfully. The high doses associated with stereotactic body radiotherapy and the significant potential risks require careful treatment set up and patient monitoring constituting a special treatment procedure   STEREOTACTIC TREATMENT MANAGEMENT:  Following delivery, the patient was evaluated clinically. The patient tolerated treatment without significant acute effects, and was discharged to home in stable condition.    PLAN: Continue treatment as planned.  _________________________   Thea Silversmith, MD

## 2014-11-18 ENCOUNTER — Ambulatory Visit: Payer: Medicare Other | Admitting: Radiation Oncology

## 2014-11-18 DIAGNOSIS — M1611 Unilateral primary osteoarthritis, right hip: Secondary | ICD-10-CM | POA: Diagnosis not present

## 2014-11-18 DIAGNOSIS — Z79899 Other long term (current) drug therapy: Secondary | ICD-10-CM | POA: Diagnosis not present

## 2014-11-19 NOTE — Progress Notes (Signed)
  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   Narrative: The patient tolerated radiation treatment relatively well.   He had minimal fatigue and no change in his dyspnea.  Plan: The patient has completed radiation treatment. The patient will return to radiation oncology clinic for routine followup in one month. I advised them to call or return sooner if they have any questions or concerns related to their recovery or treatment.  ------------------------------------------------  Thea Silversmith, MD

## 2014-11-20 ENCOUNTER — Ambulatory Visit: Payer: Medicare Other | Admitting: Radiation Oncology

## 2014-11-23 ENCOUNTER — Ambulatory Visit: Payer: Medicare Other | Admitting: Radiation Oncology

## 2014-12-02 DIAGNOSIS — E538 Deficiency of other specified B group vitamins: Secondary | ICD-10-CM | POA: Diagnosis not present

## 2014-12-15 DIAGNOSIS — C3412 Malignant neoplasm of upper lobe, left bronchus or lung: Secondary | ICD-10-CM | POA: Diagnosis not present

## 2014-12-15 DIAGNOSIS — J449 Chronic obstructive pulmonary disease, unspecified: Secondary | ICD-10-CM | POA: Diagnosis not present

## 2014-12-24 ENCOUNTER — Ambulatory Visit
Admission: RE | Admit: 2014-12-24 | Discharge: 2014-12-24 | Disposition: A | Discharge status: Home / Self Care | Payer: Medicare Other | Source: Ambulatory Visit | Attending: Radiation Oncology | Admitting: Radiation Oncology

## 2014-12-24 VITALS — BP 145/78 | HR 79 | Temp 98.2°F | Resp 20 | Wt 146.0 lb

## 2014-12-24 DIAGNOSIS — C3412 Malignant neoplasm of upper lobe, left bronchus or lung: Secondary | ICD-10-CM

## 2014-12-24 NOTE — Progress Notes (Signed)
   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 He saw Dr. Bobby Branch on 6/28. He is scheduled to see him again at the end of August. The pt believes he has a cold. He has a runny nose, eyes, and a productive cough. The pt states his wife had a cold recently. The pt states his breathing is fine after the tx.  Physical Exam:  Filed Vitals:   12/24/14 1358  BP: 145/78  Pulse: 79  Temp: 98.2 F (36.8 C)  Resp: 20  Weight: 146 lb (66.225 kg)  SpO2: 99%  He is a lovely gentleman appearing his stated age. Alert and oriented times three.  IMPRESSION: David Branch is a 74 y.o. 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  PLAN: I advised the pt to use over the counter medication for his cold and if his cough doesn't improve in a week he is to see his pcp. The pt prefers to be followed by Dr. Bobby Branch since [facility] is closer to his home. I would recommend CT scans every 4 months. In the unlikely event of tumor recurrence, I am always available for consultation or to review his films. I have released David Branch from me and will see him on a prn basis.  This document serves as a record of services personally performed by Thea Silversmith, MD. It was created on her behalf by Darcus Austin, a trained medical scribe. The creation of this record is based on the scribe's personal observations and the provider's statements to them. This document has been checked and approved by the attending provider.    Thea Silversmith, MD

## 2014-12-25 ENCOUNTER — Ambulatory Visit: Payer: Medicare Other | Admitting: Radiation Oncology

## 2015-01-01 DIAGNOSIS — E538 Deficiency of other specified B group vitamins: Secondary | ICD-10-CM | POA: Diagnosis not present

## 2015-01-21 DIAGNOSIS — Z79899 Other long term (current) drug therapy: Secondary | ICD-10-CM | POA: Diagnosis not present

## 2015-01-21 DIAGNOSIS — M1611 Unilateral primary osteoarthritis, right hip: Secondary | ICD-10-CM | POA: Diagnosis not present

## 2015-01-21 DIAGNOSIS — M179 Osteoarthritis of knee, unspecified: Secondary | ICD-10-CM | POA: Diagnosis not present

## 2015-01-21 DIAGNOSIS — L989 Disorder of the skin and subcutaneous tissue, unspecified: Secondary | ICD-10-CM | POA: Diagnosis not present

## 2015-02-01 DIAGNOSIS — E538 Deficiency of other specified B group vitamins: Secondary | ICD-10-CM | POA: Diagnosis not present

## 2015-02-15 DIAGNOSIS — C3432 Malignant neoplasm of lower lobe, left bronchus or lung: Secondary | ICD-10-CM | POA: Diagnosis not present

## 2015-02-15 DIAGNOSIS — C3412 Malignant neoplasm of upper lobe, left bronchus or lung: Secondary | ICD-10-CM | POA: Diagnosis not present

## 2015-02-16 DIAGNOSIS — C3412 Malignant neoplasm of upper lobe, left bronchus or lung: Secondary | ICD-10-CM | POA: Diagnosis not present

## 2015-03-04 DIAGNOSIS — E539 Vitamin B deficiency, unspecified: Secondary | ICD-10-CM | POA: Diagnosis not present

## 2015-03-09 DIAGNOSIS — C44329 Squamous cell carcinoma of skin of other parts of face: Secondary | ICD-10-CM | POA: Diagnosis not present

## 2015-04-05 DIAGNOSIS — Z23 Encounter for immunization: Secondary | ICD-10-CM | POA: Diagnosis not present

## 2015-04-05 DIAGNOSIS — E538 Deficiency of other specified B group vitamins: Secondary | ICD-10-CM | POA: Diagnosis not present

## 2015-04-14 DIAGNOSIS — Z79899 Other long term (current) drug therapy: Secondary | ICD-10-CM | POA: Diagnosis not present

## 2015-04-14 DIAGNOSIS — M159 Polyosteoarthritis, unspecified: Secondary | ICD-10-CM | POA: Diagnosis not present

## 2015-04-14 DIAGNOSIS — M1611 Unilateral primary osteoarthritis, right hip: Secondary | ICD-10-CM | POA: Diagnosis not present

## 2015-04-14 DIAGNOSIS — M179 Osteoarthritis of knee, unspecified: Secondary | ICD-10-CM | POA: Diagnosis not present

## 2015-05-06 DIAGNOSIS — E538 Deficiency of other specified B group vitamins: Secondary | ICD-10-CM | POA: Diagnosis not present

## 2015-05-11 DIAGNOSIS — L570 Actinic keratosis: Secondary | ICD-10-CM | POA: Diagnosis not present

## 2015-05-24 DIAGNOSIS — R1030 Lower abdominal pain, unspecified: Secondary | ICD-10-CM | POA: Diagnosis not present

## 2015-05-24 DIAGNOSIS — R319 Hematuria, unspecified: Secondary | ICD-10-CM | POA: Diagnosis not present

## 2015-05-25 DIAGNOSIS — N318 Other neuromuscular dysfunction of bladder: Secondary | ICD-10-CM | POA: Diagnosis not present

## 2015-05-25 DIAGNOSIS — R351 Nocturia: Secondary | ICD-10-CM | POA: Diagnosis not present

## 2015-05-25 DIAGNOSIS — R3912 Poor urinary stream: Secondary | ICD-10-CM | POA: Diagnosis not present

## 2015-05-25 DIAGNOSIS — N401 Enlarged prostate with lower urinary tract symptoms: Secondary | ICD-10-CM | POA: Diagnosis not present

## 2015-05-25 DIAGNOSIS — N3011 Interstitial cystitis (chronic) with hematuria: Secondary | ICD-10-CM | POA: Diagnosis not present

## 2015-05-25 DIAGNOSIS — N3001 Acute cystitis with hematuria: Secondary | ICD-10-CM | POA: Diagnosis not present

## 2015-05-25 DIAGNOSIS — R3121 Asymptomatic microscopic hematuria: Secondary | ICD-10-CM | POA: Diagnosis not present

## 2015-05-31 DIAGNOSIS — R311 Benign essential microscopic hematuria: Secondary | ICD-10-CM | POA: Diagnosis not present

## 2015-05-31 DIAGNOSIS — R911 Solitary pulmonary nodule: Secondary | ICD-10-CM | POA: Diagnosis not present

## 2015-05-31 DIAGNOSIS — R1030 Lower abdominal pain, unspecified: Secondary | ICD-10-CM | POA: Diagnosis not present

## 2015-05-31 DIAGNOSIS — R319 Hematuria, unspecified: Secondary | ICD-10-CM | POA: Diagnosis not present

## 2015-05-31 DIAGNOSIS — I700 Atherosclerosis of aorta: Secondary | ICD-10-CM | POA: Diagnosis not present

## 2015-06-03 DIAGNOSIS — R351 Nocturia: Secondary | ICD-10-CM | POA: Diagnosis not present

## 2015-06-03 DIAGNOSIS — N318 Other neuromuscular dysfunction of bladder: Secondary | ICD-10-CM | POA: Diagnosis not present

## 2015-06-03 DIAGNOSIS — R3121 Asymptomatic microscopic hematuria: Secondary | ICD-10-CM | POA: Diagnosis not present

## 2015-06-03 DIAGNOSIS — N3020 Other chronic cystitis without hematuria: Secondary | ICD-10-CM | POA: Diagnosis not present

## 2015-06-03 DIAGNOSIS — N401 Enlarged prostate with lower urinary tract symptoms: Secondary | ICD-10-CM | POA: Diagnosis not present

## 2015-06-03 DIAGNOSIS — R3912 Poor urinary stream: Secondary | ICD-10-CM | POA: Diagnosis not present

## 2015-06-03 DIAGNOSIS — E538 Deficiency of other specified B group vitamins: Secondary | ICD-10-CM | POA: Diagnosis not present

## 2015-06-17 DIAGNOSIS — R918 Other nonspecific abnormal finding of lung field: Secondary | ICD-10-CM | POA: Diagnosis not present

## 2015-06-17 DIAGNOSIS — C3490 Malignant neoplasm of unspecified part of unspecified bronchus or lung: Secondary | ICD-10-CM | POA: Diagnosis not present

## 2015-06-17 DIAGNOSIS — C3412 Malignant neoplasm of upper lobe, left bronchus or lung: Secondary | ICD-10-CM | POA: Diagnosis not present

## 2015-06-17 DIAGNOSIS — S22079A Unspecified fracture of T9-T10 vertebra, initial encounter for closed fracture: Secondary | ICD-10-CM | POA: Diagnosis not present

## 2015-06-17 DIAGNOSIS — C3432 Malignant neoplasm of lower lobe, left bronchus or lung: Secondary | ICD-10-CM | POA: Diagnosis not present

## 2015-06-18 DIAGNOSIS — M179 Osteoarthritis of knee, unspecified: Secondary | ICD-10-CM | POA: Diagnosis not present

## 2015-06-18 DIAGNOSIS — C3412 Malignant neoplasm of upper lobe, left bronchus or lung: Secondary | ICD-10-CM | POA: Diagnosis not present

## 2015-06-18 DIAGNOSIS — M159 Polyosteoarthritis, unspecified: Secondary | ICD-10-CM | POA: Diagnosis not present

## 2015-06-18 DIAGNOSIS — Z681 Body mass index (BMI) 19 or less, adult: Secondary | ICD-10-CM | POA: Diagnosis not present

## 2015-07-05 DIAGNOSIS — E539 Vitamin B deficiency, unspecified: Secondary | ICD-10-CM | POA: Diagnosis not present

## 2015-07-08 DIAGNOSIS — N318 Other neuromuscular dysfunction of bladder: Secondary | ICD-10-CM | POA: Diagnosis not present

## 2015-07-08 DIAGNOSIS — R351 Nocturia: Secondary | ICD-10-CM | POA: Diagnosis not present

## 2015-07-08 DIAGNOSIS — N401 Enlarged prostate with lower urinary tract symptoms: Secondary | ICD-10-CM | POA: Diagnosis not present

## 2015-07-08 DIAGNOSIS — N3021 Other chronic cystitis with hematuria: Secondary | ICD-10-CM | POA: Diagnosis not present

## 2015-07-08 DIAGNOSIS — R3121 Asymptomatic microscopic hematuria: Secondary | ICD-10-CM | POA: Diagnosis not present

## 2015-07-08 DIAGNOSIS — R3912 Poor urinary stream: Secondary | ICD-10-CM | POA: Diagnosis not present

## 2015-07-12 DIAGNOSIS — L570 Actinic keratosis: Secondary | ICD-10-CM | POA: Diagnosis not present

## 2015-07-12 DIAGNOSIS — C44622 Squamous cell carcinoma of skin of right upper limb, including shoulder: Secondary | ICD-10-CM | POA: Diagnosis not present

## 2015-08-05 DIAGNOSIS — Z79899 Other long term (current) drug therapy: Secondary | ICD-10-CM | POA: Diagnosis not present

## 2015-08-05 DIAGNOSIS — I10 Essential (primary) hypertension: Secondary | ICD-10-CM | POA: Diagnosis not present

## 2015-08-05 DIAGNOSIS — M159 Polyosteoarthritis, unspecified: Secondary | ICD-10-CM | POA: Diagnosis not present

## 2015-08-05 DIAGNOSIS — D519 Vitamin B12 deficiency anemia, unspecified: Secondary | ICD-10-CM | POA: Diagnosis not present

## 2015-08-05 DIAGNOSIS — M179 Osteoarthritis of knee, unspecified: Secondary | ICD-10-CM | POA: Diagnosis not present

## 2015-08-05 DIAGNOSIS — J449 Chronic obstructive pulmonary disease, unspecified: Secondary | ICD-10-CM | POA: Diagnosis not present

## 2015-08-05 DIAGNOSIS — Z8673 Personal history of transient ischemic attack (TIA), and cerebral infarction without residual deficits: Secondary | ICD-10-CM | POA: Diagnosis not present

## 2015-08-05 DIAGNOSIS — M1611 Unilateral primary osteoarthritis, right hip: Secondary | ICD-10-CM | POA: Diagnosis not present

## 2015-08-23 DIAGNOSIS — D044 Carcinoma in situ of skin of scalp and neck: Secondary | ICD-10-CM | POA: Diagnosis not present

## 2015-08-23 DIAGNOSIS — C44329 Squamous cell carcinoma of skin of other parts of face: Secondary | ICD-10-CM | POA: Diagnosis not present

## 2015-08-23 DIAGNOSIS — L570 Actinic keratosis: Secondary | ICD-10-CM | POA: Diagnosis not present

## 2015-09-07 DIAGNOSIS — E538 Deficiency of other specified B group vitamins: Secondary | ICD-10-CM | POA: Diagnosis not present

## 2015-09-14 DIAGNOSIS — R0602 Shortness of breath: Secondary | ICD-10-CM | POA: Diagnosis not present

## 2015-09-14 DIAGNOSIS — I10 Essential (primary) hypertension: Secondary | ICD-10-CM | POA: Diagnosis present

## 2015-09-14 DIAGNOSIS — J441 Chronic obstructive pulmonary disease with (acute) exacerbation: Secondary | ICD-10-CM | POA: Diagnosis present

## 2015-09-14 DIAGNOSIS — Z8673 Personal history of transient ischemic attack (TIA), and cerebral infarction without residual deficits: Secondary | ICD-10-CM | POA: Diagnosis not present

## 2015-09-14 DIAGNOSIS — D649 Anemia, unspecified: Secondary | ICD-10-CM | POA: Diagnosis not present

## 2015-09-14 DIAGNOSIS — J189 Pneumonia, unspecified organism: Secondary | ICD-10-CM | POA: Diagnosis not present

## 2015-09-14 DIAGNOSIS — M1990 Unspecified osteoarthritis, unspecified site: Secondary | ICD-10-CM | POA: Diagnosis present

## 2015-09-14 DIAGNOSIS — E871 Hypo-osmolality and hyponatremia: Secondary | ICD-10-CM | POA: Diagnosis not present

## 2015-09-14 DIAGNOSIS — J440 Chronic obstructive pulmonary disease with acute lower respiratory infection: Secondary | ICD-10-CM | POA: Diagnosis present

## 2015-09-14 DIAGNOSIS — G92 Toxic encephalopathy: Secondary | ICD-10-CM | POA: Diagnosis not present

## 2015-09-14 DIAGNOSIS — J1108 Influenza due to unidentified influenza virus with specified pneumonia: Secondary | ICD-10-CM | POA: Diagnosis not present

## 2015-09-14 DIAGNOSIS — R05 Cough: Secondary | ICD-10-CM | POA: Diagnosis not present

## 2015-09-14 DIAGNOSIS — J9601 Acute respiratory failure with hypoxia: Secondary | ICD-10-CM | POA: Diagnosis not present

## 2015-09-14 DIAGNOSIS — E876 Hypokalemia: Secondary | ICD-10-CM | POA: Diagnosis not present

## 2015-09-14 DIAGNOSIS — Z7982 Long term (current) use of aspirin: Secondary | ICD-10-CM | POA: Diagnosis not present

## 2015-09-14 DIAGNOSIS — C3412 Malignant neoplasm of upper lobe, left bronchus or lung: Secondary | ICD-10-CM | POA: Diagnosis present

## 2015-09-14 DIAGNOSIS — Z79899 Other long term (current) drug therapy: Secondary | ICD-10-CM | POA: Diagnosis not present

## 2015-09-14 DIAGNOSIS — F17210 Nicotine dependence, cigarettes, uncomplicated: Secondary | ICD-10-CM | POA: Diagnosis present

## 2015-09-14 DIAGNOSIS — E440 Moderate protein-calorie malnutrition: Secondary | ICD-10-CM | POA: Diagnosis not present

## 2015-10-15 DIAGNOSIS — J449 Chronic obstructive pulmonary disease, unspecified: Secondary | ICD-10-CM | POA: Diagnosis not present

## 2015-10-15 DIAGNOSIS — I10 Essential (primary) hypertension: Secondary | ICD-10-CM | POA: Diagnosis not present

## 2015-10-15 DIAGNOSIS — R404 Transient alteration of awareness: Secondary | ICD-10-CM | POA: Diagnosis not present

## 2015-10-15 DIAGNOSIS — R531 Weakness: Secondary | ICD-10-CM | POA: Diagnosis not present

## 2015-10-15 DIAGNOSIS — C3490 Malignant neoplasm of unspecified part of unspecified bronchus or lung: Secondary | ICD-10-CM | POA: Diagnosis not present

## 2015-10-15 DIAGNOSIS — R05 Cough: Secondary | ICD-10-CM | POA: Diagnosis not present

## 2015-10-16 DIAGNOSIS — Z720 Tobacco use: Secondary | ICD-10-CM | POA: Diagnosis not present

## 2015-10-16 DIAGNOSIS — S50811D Abrasion of right forearm, subsequent encounter: Secondary | ICD-10-CM | POA: Diagnosis not present

## 2015-10-16 DIAGNOSIS — J449 Chronic obstructive pulmonary disease, unspecified: Secondary | ICD-10-CM | POA: Diagnosis not present

## 2015-10-16 DIAGNOSIS — Z9181 History of falling: Secondary | ICD-10-CM | POA: Diagnosis not present

## 2015-10-16 DIAGNOSIS — Z8701 Personal history of pneumonia (recurrent): Secondary | ICD-10-CM | POA: Diagnosis not present

## 2015-10-16 DIAGNOSIS — R2681 Unsteadiness on feet: Secondary | ICD-10-CM | POA: Diagnosis not present

## 2015-10-16 DIAGNOSIS — S50812D Abrasion of left forearm, subsequent encounter: Secondary | ICD-10-CM | POA: Diagnosis not present

## 2015-10-16 DIAGNOSIS — Z7982 Long term (current) use of aspirin: Secondary | ICD-10-CM | POA: Diagnosis not present

## 2015-10-16 DIAGNOSIS — E871 Hypo-osmolality and hyponatremia: Secondary | ICD-10-CM | POA: Diagnosis not present

## 2015-10-16 DIAGNOSIS — D649 Anemia, unspecified: Secondary | ICD-10-CM | POA: Diagnosis not present

## 2015-10-16 DIAGNOSIS — E440 Moderate protein-calorie malnutrition: Secondary | ICD-10-CM | POA: Diagnosis not present

## 2015-10-16 DIAGNOSIS — I10 Essential (primary) hypertension: Secondary | ICD-10-CM | POA: Diagnosis not present

## 2015-10-16 DIAGNOSIS — C3412 Malignant neoplasm of upper lobe, left bronchus or lung: Secondary | ICD-10-CM | POA: Diagnosis not present

## 2015-10-17 DIAGNOSIS — C3412 Malignant neoplasm of upper lobe, left bronchus or lung: Secondary | ICD-10-CM | POA: Diagnosis not present

## 2015-10-17 DIAGNOSIS — E440 Moderate protein-calorie malnutrition: Secondary | ICD-10-CM | POA: Diagnosis not present

## 2015-10-17 DIAGNOSIS — I10 Essential (primary) hypertension: Secondary | ICD-10-CM | POA: Diagnosis not present

## 2015-10-17 DIAGNOSIS — R2681 Unsteadiness on feet: Secondary | ICD-10-CM | POA: Diagnosis not present

## 2015-10-17 DIAGNOSIS — J449 Chronic obstructive pulmonary disease, unspecified: Secondary | ICD-10-CM | POA: Diagnosis not present

## 2015-10-17 DIAGNOSIS — E871 Hypo-osmolality and hyponatremia: Secondary | ICD-10-CM | POA: Diagnosis not present

## 2015-10-18 DIAGNOSIS — R911 Solitary pulmonary nodule: Secondary | ICD-10-CM | POA: Diagnosis not present

## 2015-10-18 DIAGNOSIS — R2681 Unsteadiness on feet: Secondary | ICD-10-CM | POA: Diagnosis not present

## 2015-10-18 DIAGNOSIS — R918 Other nonspecific abnormal finding of lung field: Secondary | ICD-10-CM | POA: Diagnosis not present

## 2015-10-18 DIAGNOSIS — D649 Anemia, unspecified: Secondary | ICD-10-CM | POA: Diagnosis not present

## 2015-10-18 DIAGNOSIS — J449 Chronic obstructive pulmonary disease, unspecified: Secondary | ICD-10-CM | POA: Diagnosis not present

## 2015-10-18 DIAGNOSIS — E871 Hypo-osmolality and hyponatremia: Secondary | ICD-10-CM | POA: Diagnosis not present

## 2015-10-18 DIAGNOSIS — E440 Moderate protein-calorie malnutrition: Secondary | ICD-10-CM | POA: Diagnosis not present

## 2015-10-18 DIAGNOSIS — I10 Essential (primary) hypertension: Secondary | ICD-10-CM | POA: Diagnosis not present

## 2015-10-18 DIAGNOSIS — C3412 Malignant neoplasm of upper lobe, left bronchus or lung: Secondary | ICD-10-CM | POA: Diagnosis not present

## 2015-10-19 DIAGNOSIS — Z720 Tobacco use: Secondary | ICD-10-CM | POA: Diagnosis not present

## 2015-10-19 DIAGNOSIS — C3412 Malignant neoplasm of upper lobe, left bronchus or lung: Secondary | ICD-10-CM | POA: Diagnosis not present

## 2015-10-19 DIAGNOSIS — R918 Other nonspecific abnormal finding of lung field: Secondary | ICD-10-CM | POA: Diagnosis not present

## 2015-10-20 DIAGNOSIS — R2681 Unsteadiness on feet: Secondary | ICD-10-CM | POA: Diagnosis not present

## 2015-10-20 DIAGNOSIS — I10 Essential (primary) hypertension: Secondary | ICD-10-CM | POA: Diagnosis not present

## 2015-10-20 DIAGNOSIS — Z681 Body mass index (BMI) 19 or less, adult: Secondary | ICD-10-CM | POA: Diagnosis not present

## 2015-10-20 DIAGNOSIS — E440 Moderate protein-calorie malnutrition: Secondary | ICD-10-CM | POA: Diagnosis not present

## 2015-10-20 DIAGNOSIS — E871 Hypo-osmolality and hyponatremia: Secondary | ICD-10-CM | POA: Diagnosis not present

## 2015-10-20 DIAGNOSIS — R300 Dysuria: Secondary | ICD-10-CM | POA: Diagnosis not present

## 2015-10-20 DIAGNOSIS — J189 Pneumonia, unspecified organism: Secondary | ICD-10-CM | POA: Diagnosis not present

## 2015-10-20 DIAGNOSIS — J449 Chronic obstructive pulmonary disease, unspecified: Secondary | ICD-10-CM | POA: Diagnosis not present

## 2015-10-20 DIAGNOSIS — Z79899 Other long term (current) drug therapy: Secondary | ICD-10-CM | POA: Diagnosis not present

## 2015-10-20 DIAGNOSIS — C3412 Malignant neoplasm of upper lobe, left bronchus or lung: Secondary | ICD-10-CM | POA: Diagnosis not present

## 2015-10-20 DIAGNOSIS — E538 Deficiency of other specified B group vitamins: Secondary | ICD-10-CM | POA: Diagnosis not present

## 2015-10-20 DIAGNOSIS — Z09 Encounter for follow-up examination after completed treatment for conditions other than malignant neoplasm: Secondary | ICD-10-CM | POA: Diagnosis not present

## 2015-10-21 DIAGNOSIS — R2681 Unsteadiness on feet: Secondary | ICD-10-CM | POA: Diagnosis not present

## 2015-10-21 DIAGNOSIS — E440 Moderate protein-calorie malnutrition: Secondary | ICD-10-CM | POA: Diagnosis not present

## 2015-10-21 DIAGNOSIS — C3412 Malignant neoplasm of upper lobe, left bronchus or lung: Secondary | ICD-10-CM | POA: Diagnosis not present

## 2015-10-21 DIAGNOSIS — J449 Chronic obstructive pulmonary disease, unspecified: Secondary | ICD-10-CM | POA: Diagnosis not present

## 2015-10-21 DIAGNOSIS — E871 Hypo-osmolality and hyponatremia: Secondary | ICD-10-CM | POA: Diagnosis not present

## 2015-10-21 DIAGNOSIS — I10 Essential (primary) hypertension: Secondary | ICD-10-CM | POA: Diagnosis not present

## 2015-10-22 DIAGNOSIS — E440 Moderate protein-calorie malnutrition: Secondary | ICD-10-CM | POA: Diagnosis not present

## 2015-10-22 DIAGNOSIS — I10 Essential (primary) hypertension: Secondary | ICD-10-CM | POA: Diagnosis not present

## 2015-10-22 DIAGNOSIS — C3412 Malignant neoplasm of upper lobe, left bronchus or lung: Secondary | ICD-10-CM | POA: Diagnosis not present

## 2015-10-22 DIAGNOSIS — J449 Chronic obstructive pulmonary disease, unspecified: Secondary | ICD-10-CM | POA: Diagnosis not present

## 2015-10-22 DIAGNOSIS — E871 Hypo-osmolality and hyponatremia: Secondary | ICD-10-CM | POA: Diagnosis not present

## 2015-10-22 DIAGNOSIS — R2681 Unsteadiness on feet: Secondary | ICD-10-CM | POA: Diagnosis not present

## 2015-10-25 DIAGNOSIS — R2681 Unsteadiness on feet: Secondary | ICD-10-CM | POA: Diagnosis not present

## 2015-10-25 DIAGNOSIS — E440 Moderate protein-calorie malnutrition: Secondary | ICD-10-CM | POA: Diagnosis not present

## 2015-10-25 DIAGNOSIS — I10 Essential (primary) hypertension: Secondary | ICD-10-CM | POA: Diagnosis not present

## 2015-10-25 DIAGNOSIS — C3412 Malignant neoplasm of upper lobe, left bronchus or lung: Secondary | ICD-10-CM | POA: Diagnosis not present

## 2015-10-25 DIAGNOSIS — E871 Hypo-osmolality and hyponatremia: Secondary | ICD-10-CM | POA: Diagnosis not present

## 2015-10-25 DIAGNOSIS — J449 Chronic obstructive pulmonary disease, unspecified: Secondary | ICD-10-CM | POA: Diagnosis not present

## 2015-10-27 DIAGNOSIS — R2681 Unsteadiness on feet: Secondary | ICD-10-CM | POA: Diagnosis not present

## 2015-10-27 DIAGNOSIS — J449 Chronic obstructive pulmonary disease, unspecified: Secondary | ICD-10-CM | POA: Diagnosis not present

## 2015-10-27 DIAGNOSIS — C3412 Malignant neoplasm of upper lobe, left bronchus or lung: Secondary | ICD-10-CM | POA: Diagnosis not present

## 2015-10-27 DIAGNOSIS — E440 Moderate protein-calorie malnutrition: Secondary | ICD-10-CM | POA: Diagnosis not present

## 2015-10-27 DIAGNOSIS — I10 Essential (primary) hypertension: Secondary | ICD-10-CM | POA: Diagnosis not present

## 2015-10-27 DIAGNOSIS — E871 Hypo-osmolality and hyponatremia: Secondary | ICD-10-CM | POA: Diagnosis not present

## 2015-10-28 DIAGNOSIS — J449 Chronic obstructive pulmonary disease, unspecified: Secondary | ICD-10-CM | POA: Diagnosis not present

## 2015-10-28 DIAGNOSIS — E440 Moderate protein-calorie malnutrition: Secondary | ICD-10-CM | POA: Diagnosis not present

## 2015-10-28 DIAGNOSIS — R2681 Unsteadiness on feet: Secondary | ICD-10-CM | POA: Diagnosis not present

## 2015-10-28 DIAGNOSIS — C3412 Malignant neoplasm of upper lobe, left bronchus or lung: Secondary | ICD-10-CM | POA: Diagnosis not present

## 2015-10-28 DIAGNOSIS — I10 Essential (primary) hypertension: Secondary | ICD-10-CM | POA: Diagnosis not present

## 2015-10-28 DIAGNOSIS — E871 Hypo-osmolality and hyponatremia: Secondary | ICD-10-CM | POA: Diagnosis not present

## 2015-10-29 DIAGNOSIS — E871 Hypo-osmolality and hyponatremia: Secondary | ICD-10-CM | POA: Diagnosis not present

## 2015-10-29 DIAGNOSIS — R2681 Unsteadiness on feet: Secondary | ICD-10-CM | POA: Diagnosis not present

## 2015-10-29 DIAGNOSIS — C3412 Malignant neoplasm of upper lobe, left bronchus or lung: Secondary | ICD-10-CM | POA: Diagnosis not present

## 2015-10-29 DIAGNOSIS — E440 Moderate protein-calorie malnutrition: Secondary | ICD-10-CM | POA: Diagnosis not present

## 2015-10-29 DIAGNOSIS — I10 Essential (primary) hypertension: Secondary | ICD-10-CM | POA: Diagnosis not present

## 2015-10-29 DIAGNOSIS — J449 Chronic obstructive pulmonary disease, unspecified: Secondary | ICD-10-CM | POA: Diagnosis not present

## 2015-11-02 DIAGNOSIS — C3412 Malignant neoplasm of upper lobe, left bronchus or lung: Secondary | ICD-10-CM | POA: Diagnosis not present

## 2015-11-02 DIAGNOSIS — I10 Essential (primary) hypertension: Secondary | ICD-10-CM | POA: Diagnosis not present

## 2015-11-02 DIAGNOSIS — E440 Moderate protein-calorie malnutrition: Secondary | ICD-10-CM | POA: Diagnosis not present

## 2015-11-02 DIAGNOSIS — E871 Hypo-osmolality and hyponatremia: Secondary | ICD-10-CM | POA: Diagnosis not present

## 2015-11-02 DIAGNOSIS — R2681 Unsteadiness on feet: Secondary | ICD-10-CM | POA: Diagnosis not present

## 2015-11-02 DIAGNOSIS — J449 Chronic obstructive pulmonary disease, unspecified: Secondary | ICD-10-CM | POA: Diagnosis not present

## 2015-11-03 DIAGNOSIS — I10 Essential (primary) hypertension: Secondary | ICD-10-CM | POA: Diagnosis not present

## 2015-11-03 DIAGNOSIS — C3412 Malignant neoplasm of upper lobe, left bronchus or lung: Secondary | ICD-10-CM | POA: Diagnosis not present

## 2015-11-03 DIAGNOSIS — E871 Hypo-osmolality and hyponatremia: Secondary | ICD-10-CM | POA: Diagnosis not present

## 2015-11-03 DIAGNOSIS — E440 Moderate protein-calorie malnutrition: Secondary | ICD-10-CM | POA: Diagnosis not present

## 2015-11-03 DIAGNOSIS — R2681 Unsteadiness on feet: Secondary | ICD-10-CM | POA: Diagnosis not present

## 2015-11-03 DIAGNOSIS — J449 Chronic obstructive pulmonary disease, unspecified: Secondary | ICD-10-CM | POA: Diagnosis not present

## 2015-11-05 DIAGNOSIS — C3412 Malignant neoplasm of upper lobe, left bronchus or lung: Secondary | ICD-10-CM | POA: Diagnosis not present

## 2015-11-05 DIAGNOSIS — I10 Essential (primary) hypertension: Secondary | ICD-10-CM | POA: Diagnosis not present

## 2015-11-05 DIAGNOSIS — J449 Chronic obstructive pulmonary disease, unspecified: Secondary | ICD-10-CM | POA: Diagnosis not present

## 2015-11-05 DIAGNOSIS — E871 Hypo-osmolality and hyponatremia: Secondary | ICD-10-CM | POA: Diagnosis not present

## 2015-11-05 DIAGNOSIS — E440 Moderate protein-calorie malnutrition: Secondary | ICD-10-CM | POA: Diagnosis not present

## 2015-11-05 DIAGNOSIS — R2681 Unsteadiness on feet: Secondary | ICD-10-CM | POA: Diagnosis not present

## 2015-11-08 DIAGNOSIS — E440 Moderate protein-calorie malnutrition: Secondary | ICD-10-CM | POA: Diagnosis not present

## 2015-11-08 DIAGNOSIS — J449 Chronic obstructive pulmonary disease, unspecified: Secondary | ICD-10-CM | POA: Diagnosis not present

## 2015-11-08 DIAGNOSIS — I10 Essential (primary) hypertension: Secondary | ICD-10-CM | POA: Diagnosis not present

## 2015-11-08 DIAGNOSIS — E871 Hypo-osmolality and hyponatremia: Secondary | ICD-10-CM | POA: Diagnosis not present

## 2015-11-08 DIAGNOSIS — R2681 Unsteadiness on feet: Secondary | ICD-10-CM | POA: Diagnosis not present

## 2015-11-08 DIAGNOSIS — C3412 Malignant neoplasm of upper lobe, left bronchus or lung: Secondary | ICD-10-CM | POA: Diagnosis not present

## 2015-11-08 DIAGNOSIS — J189 Pneumonia, unspecified organism: Secondary | ICD-10-CM | POA: Diagnosis not present

## 2015-11-09 DIAGNOSIS — J449 Chronic obstructive pulmonary disease, unspecified: Secondary | ICD-10-CM | POA: Diagnosis not present

## 2015-11-09 DIAGNOSIS — R2681 Unsteadiness on feet: Secondary | ICD-10-CM | POA: Diagnosis not present

## 2015-11-09 DIAGNOSIS — I10 Essential (primary) hypertension: Secondary | ICD-10-CM | POA: Diagnosis not present

## 2015-11-09 DIAGNOSIS — E871 Hypo-osmolality and hyponatremia: Secondary | ICD-10-CM | POA: Diagnosis not present

## 2015-11-09 DIAGNOSIS — C3412 Malignant neoplasm of upper lobe, left bronchus or lung: Secondary | ICD-10-CM | POA: Diagnosis not present

## 2015-11-09 DIAGNOSIS — E440 Moderate protein-calorie malnutrition: Secondary | ICD-10-CM | POA: Diagnosis not present

## 2015-11-10 DIAGNOSIS — R2681 Unsteadiness on feet: Secondary | ICD-10-CM | POA: Diagnosis not present

## 2015-11-10 DIAGNOSIS — E440 Moderate protein-calorie malnutrition: Secondary | ICD-10-CM | POA: Diagnosis not present

## 2015-11-10 DIAGNOSIS — J449 Chronic obstructive pulmonary disease, unspecified: Secondary | ICD-10-CM | POA: Diagnosis not present

## 2015-11-10 DIAGNOSIS — E871 Hypo-osmolality and hyponatremia: Secondary | ICD-10-CM | POA: Diagnosis not present

## 2015-11-10 DIAGNOSIS — C3412 Malignant neoplasm of upper lobe, left bronchus or lung: Secondary | ICD-10-CM | POA: Diagnosis not present

## 2015-11-10 DIAGNOSIS — I10 Essential (primary) hypertension: Secondary | ICD-10-CM | POA: Diagnosis not present

## 2015-11-11 DIAGNOSIS — E871 Hypo-osmolality and hyponatremia: Secondary | ICD-10-CM | POA: Diagnosis not present

## 2015-11-11 DIAGNOSIS — C3412 Malignant neoplasm of upper lobe, left bronchus or lung: Secondary | ICD-10-CM | POA: Diagnosis not present

## 2015-11-11 DIAGNOSIS — I10 Essential (primary) hypertension: Secondary | ICD-10-CM | POA: Diagnosis not present

## 2015-11-11 DIAGNOSIS — J449 Chronic obstructive pulmonary disease, unspecified: Secondary | ICD-10-CM | POA: Diagnosis not present

## 2015-11-11 DIAGNOSIS — E440 Moderate protein-calorie malnutrition: Secondary | ICD-10-CM | POA: Diagnosis not present

## 2015-11-11 DIAGNOSIS — R2681 Unsteadiness on feet: Secondary | ICD-10-CM | POA: Diagnosis not present

## 2016-01-28 DIAGNOSIS — N318 Other neuromuscular dysfunction of bladder: Secondary | ICD-10-CM | POA: Diagnosis not present

## 2016-01-28 DIAGNOSIS — R3912 Poor urinary stream: Secondary | ICD-10-CM | POA: Diagnosis not present

## 2016-01-28 DIAGNOSIS — R351 Nocturia: Secondary | ICD-10-CM | POA: Diagnosis not present

## 2016-01-28 DIAGNOSIS — N401 Enlarged prostate with lower urinary tract symptoms: Secondary | ICD-10-CM | POA: Diagnosis not present

## 2016-01-28 DIAGNOSIS — N3020 Other chronic cystitis without hematuria: Secondary | ICD-10-CM | POA: Diagnosis not present

## 2016-02-22 DIAGNOSIS — C3412 Malignant neoplasm of upper lobe, left bronchus or lung: Secondary | ICD-10-CM | POA: Diagnosis not present

## 2016-05-15 DIAGNOSIS — L02413 Cutaneous abscess of right upper limb: Secondary | ICD-10-CM | POA: Diagnosis not present

## 2016-05-23 DIAGNOSIS — C3412 Malignant neoplasm of upper lobe, left bronchus or lung: Secondary | ICD-10-CM | POA: Diagnosis not present

## 2016-06-22 IMAGING — CR DG CHEST 1V PORT
1 series · 1 of 1 positions shown · non-contrast
Comparison: 10/07/2004.

CLINICAL DATA: Smoking history.  Prior bronchoscopy.

EXAM:
PORTABLE CHEST - 1 VIEW

[AP]
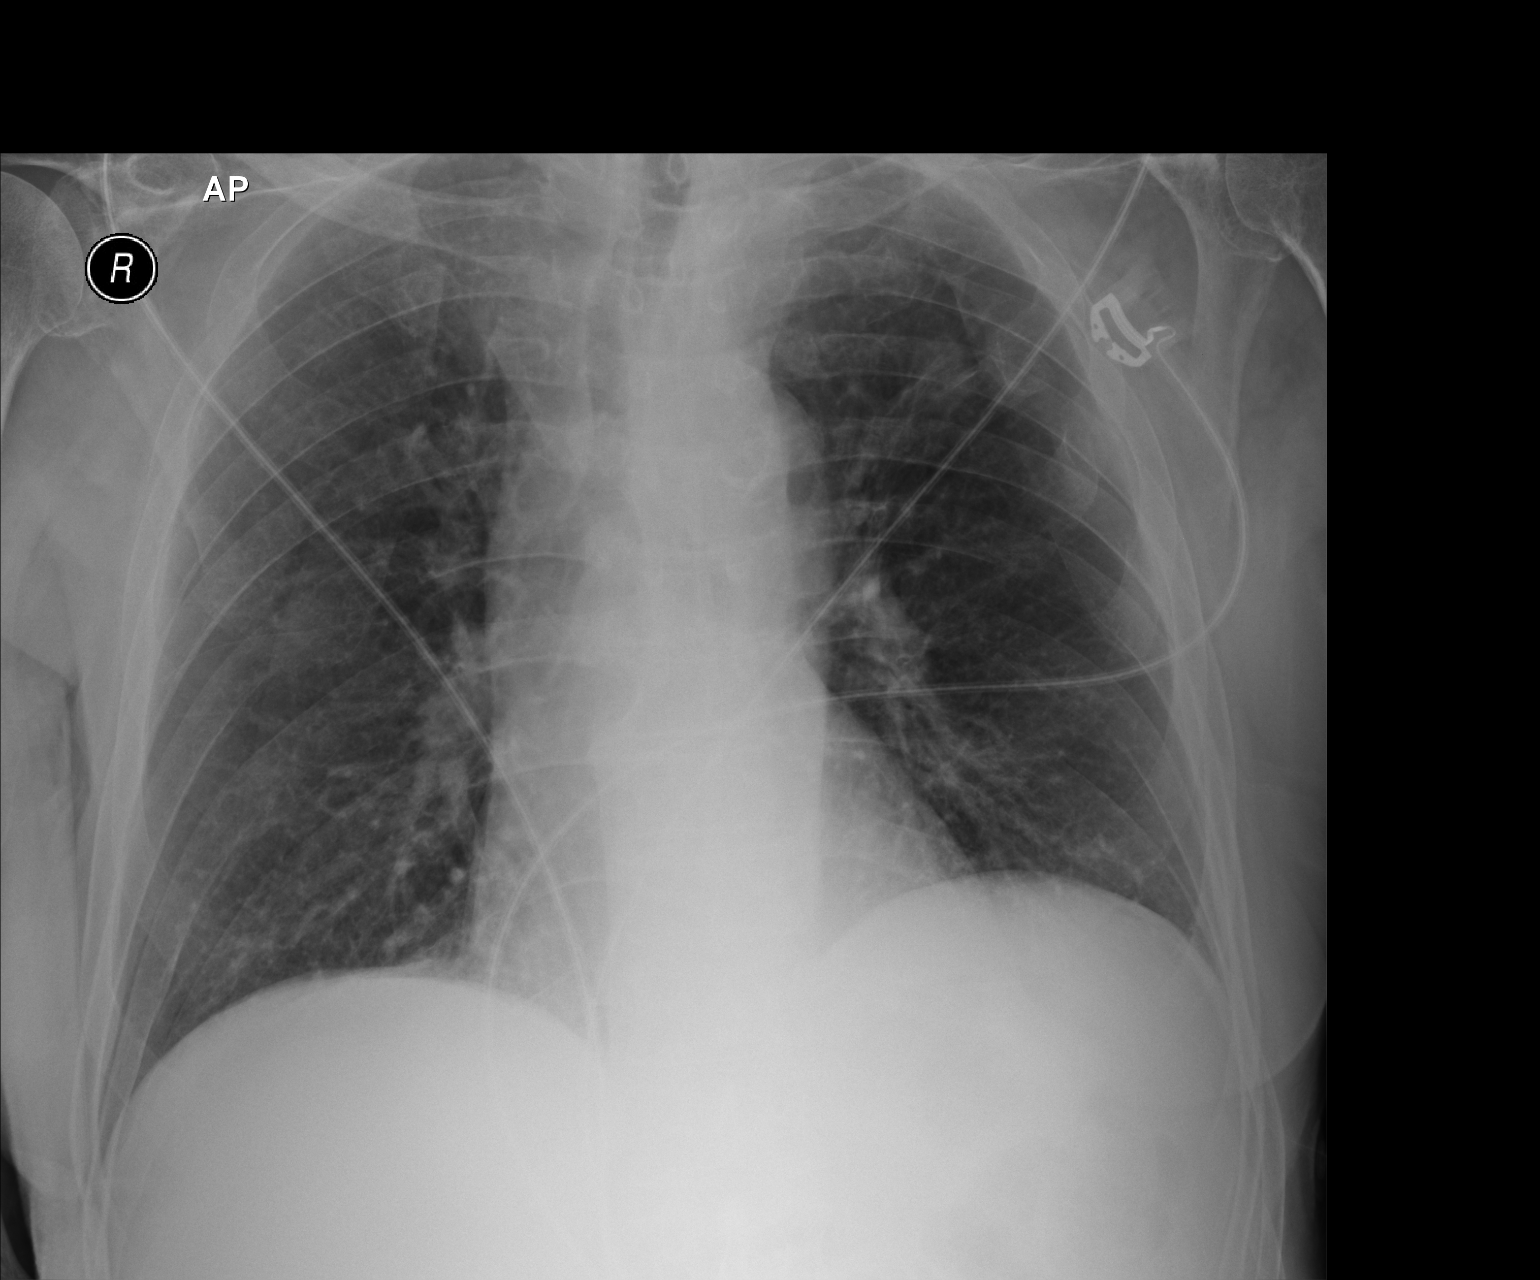

[1 of 1 positions shown; findings below may reference images not displayed]

FINDINGS: Mediastinum and hilar structures stable. Heart size stable.
Persistent left upper lobe lesion noted. No pleural effusion or
pneumothorax. No acute bony abnormality. Old left rib fractures.
IMPRESSION: 1. Persistent left upper lobe nodular density noted.
2. No evidence of pneumothorax post bronchoscopy.

## 2016-09-25 DIAGNOSIS — C3412 Malignant neoplasm of upper lobe, left bronchus or lung: Secondary | ICD-10-CM | POA: Diagnosis not present

## 2016-09-25 DIAGNOSIS — R918 Other nonspecific abnormal finding of lung field: Secondary | ICD-10-CM | POA: Diagnosis not present

## 2016-11-27 DIAGNOSIS — Z923 Personal history of irradiation: Secondary | ICD-10-CM

## 2016-11-27 DIAGNOSIS — R911 Solitary pulmonary nodule: Secondary | ICD-10-CM | POA: Diagnosis not present

## 2016-11-27 DIAGNOSIS — Z85118 Personal history of other malignant neoplasm of bronchus and lung: Secondary | ICD-10-CM | POA: Diagnosis not present

## 2016-12-11 DIAGNOSIS — Z923 Personal history of irradiation: Secondary | ICD-10-CM

## 2016-12-11 DIAGNOSIS — R911 Solitary pulmonary nodule: Secondary | ICD-10-CM

## 2016-12-11 DIAGNOSIS — Z85118 Personal history of other malignant neoplasm of bronchus and lung: Secondary | ICD-10-CM

## 2017-04-13 DIAGNOSIS — Z923 Personal history of irradiation: Secondary | ICD-10-CM | POA: Diagnosis not present

## 2017-04-13 DIAGNOSIS — Z85118 Personal history of other malignant neoplasm of bronchus and lung: Secondary | ICD-10-CM

## 2017-10-12 DIAGNOSIS — Z923 Personal history of irradiation: Secondary | ICD-10-CM

## 2017-10-12 DIAGNOSIS — Z85118 Personal history of other malignant neoplasm of bronchus and lung: Secondary | ICD-10-CM

## 2017-10-12 DIAGNOSIS — R911 Solitary pulmonary nodule: Secondary | ICD-10-CM

## 2017-10-31 ENCOUNTER — Encounter: Payer: Medicare (Managed Care) | Admitting: Cardiothoracic Surgery

## 2017-11-07 ENCOUNTER — Encounter: Payer: Medicare (Managed Care) | Admitting: Cardiothoracic Surgery

## 2017-11-21 ENCOUNTER — Encounter: Payer: Medicare (Managed Care) | Admitting: Cardiothoracic Surgery

## 2017-12-01 DIAGNOSIS — D638 Anemia in other chronic diseases classified elsewhere: Secondary | ICD-10-CM

## 2017-12-01 DIAGNOSIS — S72011A Unspecified intracapsular fracture of right femur, initial encounter for closed fracture: Secondary | ICD-10-CM

## 2017-12-01 DIAGNOSIS — R338 Other retention of urine: Secondary | ICD-10-CM

## 2017-12-01 DIAGNOSIS — I10 Essential (primary) hypertension: Secondary | ICD-10-CM

## 2017-12-01 DIAGNOSIS — M25551 Pain in right hip: Secondary | ICD-10-CM | POA: Diagnosis not present

## 2017-12-01 DIAGNOSIS — N401 Enlarged prostate with lower urinary tract symptoms: Secondary | ICD-10-CM

## 2017-12-01 DIAGNOSIS — C3490 Malignant neoplasm of unspecified part of unspecified bronchus or lung: Secondary | ICD-10-CM

## 2017-12-02 DIAGNOSIS — I10 Essential (primary) hypertension: Secondary | ICD-10-CM | POA: Diagnosis not present

## 2017-12-02 DIAGNOSIS — C3490 Malignant neoplasm of unspecified part of unspecified bronchus or lung: Secondary | ICD-10-CM | POA: Diagnosis not present

## 2017-12-02 DIAGNOSIS — M25551 Pain in right hip: Secondary | ICD-10-CM | POA: Diagnosis not present

## 2017-12-02 DIAGNOSIS — S72011A Unspecified intracapsular fracture of right femur, initial encounter for closed fracture: Secondary | ICD-10-CM | POA: Diagnosis not present

## 2017-12-03 DIAGNOSIS — S72011A Unspecified intracapsular fracture of right femur, initial encounter for closed fracture: Secondary | ICD-10-CM | POA: Diagnosis not present

## 2017-12-03 DIAGNOSIS — M25551 Pain in right hip: Secondary | ICD-10-CM | POA: Diagnosis not present

## 2017-12-03 DIAGNOSIS — C3490 Malignant neoplasm of unspecified part of unspecified bronchus or lung: Secondary | ICD-10-CM | POA: Diagnosis not present

## 2017-12-03 DIAGNOSIS — I10 Essential (primary) hypertension: Secondary | ICD-10-CM | POA: Diagnosis not present

## 2017-12-04 DIAGNOSIS — I10 Essential (primary) hypertension: Secondary | ICD-10-CM | POA: Diagnosis not present

## 2017-12-04 DIAGNOSIS — C3490 Malignant neoplasm of unspecified part of unspecified bronchus or lung: Secondary | ICD-10-CM | POA: Diagnosis not present

## 2017-12-04 DIAGNOSIS — S72011A Unspecified intracapsular fracture of right femur, initial encounter for closed fracture: Secondary | ICD-10-CM | POA: Diagnosis not present

## 2017-12-04 DIAGNOSIS — M25551 Pain in right hip: Secondary | ICD-10-CM | POA: Diagnosis not present

## 2018-01-23 ENCOUNTER — Telehealth: Payer: Self-pay | Admitting: Cardiothoracic Surgery

## 2018-01-23 ENCOUNTER — Encounter: Payer: Medicaid Other | Admitting: Cardiothoracic Surgery

## 2018-02-20 ENCOUNTER — Other Ambulatory Visit: Payer: Self-pay | Admitting: *Deleted

## 2018-02-20 ENCOUNTER — Institutional Professional Consult (permissible substitution) (INDEPENDENT_AMBULATORY_CARE_PROVIDER_SITE_OTHER): Payer: Medicare (Managed Care) | Admitting: Cardiothoracic Surgery

## 2018-02-20 ENCOUNTER — Encounter: Payer: Self-pay | Admitting: Cardiothoracic Surgery

## 2018-02-20 VITALS — BP 110/67 | HR 88 | Resp 20 | Ht 69.0 in | Wt 137.0 lb

## 2018-02-20 DIAGNOSIS — R911 Solitary pulmonary nodule: Secondary | ICD-10-CM

## 2018-02-20 NOTE — Progress Notes (Signed)
PCP is Helen Hashimoto., MD Referring Provider is Marice Potter, MD  Chief Complaint  Patient presents with  . Lung Mass    Surgical eval on enlarging RLL nodule, last 11/30/17, PET Scan 02/06/18, Head CT 11/30/17    HPI: Patient examined, images of most recent CT scan of ches t[April 2019]   PET scan[August 2019] personally reviewed and discussed with patient.  Patient presents for evaluation by his oncologist Dr. Bobby Rumpf for a 1.6 cm spiculated enlarging density in the superior segment of the right lower lobe with activity on PET scan of 2.3 SUV. Patient also has a 2.3 cm supraglottic mass with SUV 11.5 suspicious for malignancy.  A small 6 mm lower right paratracheal node has activity of 4.0.  A right basilar density tree-in-bud configuration appears to be iinflammatory.  In 2016 the patient had a 2 cm left upper lobe mass non-small cell carcinoma by needle biopsy treated with SBRT.  That area now has post radiation scarring but no discrete mass.  Patient had a right lacunar thalamic stroke in 2016.  He is currently in a wheelchair after sustaining a broken hip earlier this summer.  He cannot walk.  Patient states he stopped smoking 2 years ago.  Patient denies hoarseness but does have some difficulty swallowing associated with the supraglottic mass.  He appears to be fairly thin frail and emaciated.  He is not a candidate for general anesthesia or surgery. Past Medical History:  Diagnosis Date  . Arthritis   . Bruises easily   . Cancer ([place])    lung CA;skin CA removed from face and arm  . Cataract    right  . Hypertension    takes HCTZ and Coreg daily  . Joint pain   . Seasonal allergies    takes Zyrtec daily and uses FLonase as needed  . Stroke ([place]) 08/2014   left side weakness with left side facial drop    Past Surgical History:  Procedure Laterality Date  . cataract surgery Left   . COLONOSCOPY    . ESOPHAGOGASTRODUODENOSCOPY    . skin cancer removed from face and  arm    . VIDEO BRONCHOSCOPY WITH ENDOBRONCHIAL ULTRASOUND N/A 10/08/2014   Procedure: VIDEO BRONCHOSCOPY WITH ENDOBRONCHIAL ULTRASOUND;  Surgeon: Ivin Poot, MD;  Location: [facility];  Service: Thoracic;  Laterality: N/A;    History reviewed. No pertinent family history.  Social History Social History   Tobacco Use  . Smoking status: Former Smoker    Packs/day: 1.00    Years: 55.00    Pack years: 55.00    Types: Cigarettes  . Smokeless tobacco: Former Systems developer  . Tobacco comment: trying to quit;smokes about 3 cigs per day  Substance Use Topics  . Alcohol use: Yes    Alcohol/week: 0.0 standard drinks    Comment: occasionally beer  . Drug use: No    Current Outpatient Medications  Medication Sig Dispense Refill  . aspirin 325 MG tablet Take 325 mg by mouth daily.    . carvedilol (COREG) 6.25 MG tablet Take 6.25 mg by mouth 2 (two) times daily.  3  . cetirizine (ZYRTEC) 10 MG tablet Take 10 mg by mouth daily.  5  . fluticasone (FLONASE) 50 MCG/ACT nasal spray Place 1 spray into both nostrils daily as needed.  6  . hydrochlorothiazide (HYDRODIURIL) 25 MG tablet Take 25 mg by mouth daily.  3   No current facility-administered medications for this visit.     No Known Allergies  Review of Systems  Generally poor health Goes to adult daycare Strong smoking history of COPD Brain CT scan shows the right thalamic stroke and generalized atrophy and small vessel disease  BP 110/67   Pulse 88   Resp 20   Ht 5\' 9"  (1.753 m)   Wt 137 lb (62.1 kg)   SpO2 95% Comment: RA  BMI 20.23 kg/m  Physical Exam      Exam    General- alert and comfortable but chronically ill frail and debilitated    Neck- no JVD, no cervical adenopathy palpable, no carotid bruit   Lungs-distant breath sounds with scattered rhonchi   Cor- regular rate and rhythm, no murmur , gallop   Abdomen- soft, non-tender   Extremities - warm, non-tender, minimal edema   Neuro- oriented, appropriate, mild left side  weakness    Diagnostic Tests: Spiculated 1.6 cm enlarging nodule in the superior segment of the right lower lobe with mild activity on PET scan.  Patient's oncologist has requested tissue biopsy.  Patient is not a candidate for general anesthesia or surgery and patient will be referred to interventional radiology for consideration of a transthoracic biopsy.  Patient has no understanding of the plan for the supraglottic lesion seen on PET scan.  Impression: Potential new lung cancer in right upper lobe  Plan: IR evaluation for percutaneous biopsy of the superior segment right upper lobe nodule.  Right basilar infiltrate appears to be inflammatory.  Scarring left upper lobe appears to be result of previous stereotactic radiation therapy   Len Childs, MD [facility] [phone]

## 2018-03-20 ENCOUNTER — Encounter: Payer: Medicare (Managed Care) | Admitting: Cardiothoracic Surgery

## 2018-08-18 DEATH — deceased
